# Patient Record
Sex: Male | Born: 1975 | Race: Black or African American | Hispanic: Yes | Marital: Single | Smoking: Current every day smoker
Health system: Southern US, Community
[De-identification: ages and names within clinical notes are randomized; demographics above are authoritative.]

## PROBLEM LIST (undated history)

## (undated) DIAGNOSIS — T7840XA Allergy, unspecified, initial encounter: Secondary | ICD-10-CM

## (undated) DIAGNOSIS — E119 Type 2 diabetes mellitus without complications: Secondary | ICD-10-CM

## (undated) DIAGNOSIS — B019 Varicella without complication: Secondary | ICD-10-CM

## (undated) DIAGNOSIS — K589 Irritable bowel syndrome without diarrhea: Secondary | ICD-10-CM

## (undated) DIAGNOSIS — I10 Essential (primary) hypertension: Secondary | ICD-10-CM

## (undated) HISTORY — DX: Essential (primary) hypertension: I10

## (undated) HISTORY — PX: WISDOM TOOTH EXTRACTION: SHX21

## (undated) HISTORY — DX: Type 2 diabetes mellitus without complications: E11.9

## (undated) HISTORY — DX: Irritable bowel syndrome, unspecified: K58.9

## (undated) HISTORY — DX: Allergy, unspecified, initial encounter: T78.40XA

## (undated) HISTORY — DX: Varicella without complication: B01.9

---

## 2017-07-22 ENCOUNTER — Encounter: Payer: Self-pay | Admitting: Family Medicine

## 2017-07-22 ENCOUNTER — Ambulatory Visit (INDEPENDENT_AMBULATORY_CARE_PROVIDER_SITE_OTHER): Payer: Managed Care, Other (non HMO) | Admitting: Family Medicine

## 2017-07-22 VITALS — BP 122/80 | HR 80 | Resp 12 | Ht 66.75 in | Wt 181.2 lb

## 2017-07-22 DIAGNOSIS — I10 Essential (primary) hypertension: Secondary | ICD-10-CM

## 2017-07-22 DIAGNOSIS — W57XXXA Bitten or stung by nonvenomous insect and other nonvenomous arthropods, initial encounter: Secondary | ICD-10-CM

## 2017-07-22 DIAGNOSIS — R202 Paresthesia of skin: Secondary | ICD-10-CM | POA: Diagnosis not present

## 2017-07-22 DIAGNOSIS — S30861A Insect bite (nonvenomous) of abdominal wall, initial encounter: Secondary | ICD-10-CM

## 2017-07-22 DIAGNOSIS — R198 Other specified symptoms and signs involving the digestive system and abdomen: Secondary | ICD-10-CM | POA: Insufficient documentation

## 2017-07-22 DIAGNOSIS — K5900 Constipation, unspecified: Secondary | ICD-10-CM | POA: Diagnosis not present

## 2017-07-22 DIAGNOSIS — R200 Anesthesia of skin: Secondary | ICD-10-CM

## 2017-07-22 DIAGNOSIS — E1149 Type 2 diabetes mellitus with other diabetic neurological complication: Secondary | ICD-10-CM | POA: Insufficient documentation

## 2017-07-22 LAB — LIPID PANEL
CHOLESTEROL: 147 mg/dL (ref 0–200)
HDL: 52 mg/dL (ref >39.00)
LDL CALC: 80 mg/dL (ref 0–99)
NonHDL: 94.6
TRIGLYCERIDES: 74 mg/dL (ref 0.0–149.0)
Total CHOL/HDL Ratio: 3
VLDL: 14.8 mg/dL (ref 0.0–40.0)

## 2017-07-22 LAB — COMPREHENSIVE METABOLIC PANEL
ALK PHOS: 57 U/L (ref 39–117)
ALT: 19 U/L (ref 0–53)
AST: 11 U/L (ref 0–37)
Albumin: 4.6 g/dL (ref 3.5–5.2)
BILIRUBIN TOTAL: 1.2 mg/dL (ref 0.2–1.2)
BUN: 8 mg/dL (ref 6–23)
CALCIUM: 9.4 mg/dL (ref 8.4–10.5)
CO2: 27 mEq/L (ref 19–32)
Chloride: 105 mEq/L (ref 96–112)
Creatinine, Ser: 0.58 mg/dL (ref 0.40–1.50)
GFR: 197.61 mL/min (ref >60.00)
GLUCOSE: 331 mg/dL — AB (ref 70–99)
POTASSIUM: 4.1 meq/L (ref 3.5–5.1)
Sodium: 139 mEq/L (ref 135–145)
TOTAL PROTEIN: 6.8 g/dL (ref 6.0–8.3)

## 2017-07-22 LAB — CBC
HEMATOCRIT: 46.7 % (ref 39.0–52.0)
Hemoglobin: 15.3 g/dL (ref 13.0–17.0)
MCHC: 32.8 g/dL (ref 30.0–36.0)
MCV: 80.9 fl (ref 78.0–100.0)
Platelets: 224 10*3/uL (ref 150.0–400.0)
RBC: 5.78 Mil/uL (ref 4.22–5.81)
RDW: 13.7 % (ref 11.5–15.5)
WBC: 4.6 10*3/uL (ref 4.0–10.5)

## 2017-07-22 LAB — POCT GLYCOSYLATED HEMOGLOBIN (HGB A1C): HEMOGLOBIN A1C: 13.1

## 2017-07-22 LAB — MICROALBUMIN / CREATININE URINE RATIO
CREATININE, U: 72 mg/dL
MICROALB/CREAT RATIO: 1 mg/g (ref 0.0–30.0)
Microalb, Ur: <0.7 mg/dL (ref 0.0–1.9)

## 2017-07-22 LAB — VITAMIN B12: Vitamin B-12: 396 pg/mL (ref 211–911)

## 2017-07-22 LAB — HEMOGLOBIN A1C: HEMOGLOBIN A1C: 13.2 % — AB (ref 4.6–6.5)

## 2017-07-22 LAB — TSH: TSH: 0.83 u[IU]/mL (ref 0.35–4.50)

## 2017-07-22 MED ORDER — LIRAGLUTIDE 18 MG/3ML ~~LOC~~ SOPN: 1.2000 mg | PEN_INJECTOR | Freq: Every day | SUBCUTANEOUS | 3 refills | Status: DC

## 2017-07-22 MED ORDER — ONETOUCH ULTRA 2 W/DEVICE KIT: 1.0000 | PACK | Freq: Two times a day (BID) | 0 refills | Status: AC

## 2017-07-22 MED ORDER — GLUCOSE BLOOD VI STRP: ORAL_STRIP | 3 refills | Status: AC

## 2017-07-22 MED ORDER — INSULIN NPH ISOPHANE & REGULAR (70-30) 100 UNIT/ML ~~LOC~~ SUSP: SUBCUTANEOUS | 1 refills | Status: DC

## 2017-07-22 MED ORDER — ONETOUCH ULTRASOFT LANCETS MISC: 3 refills | Status: AC

## 2017-07-22 NOTE — Progress Notes (Signed)
HPI:   Mitchell Oneill is a 41 y.o. male, who is here today with his girlfriend to establish care.  Former PCP: N/A Last preventive routine visit: 2-3 years ago.  Chronic medical problems: DM II,HTN, allergies, and past history of depression.   Concerns today:  Diabetes Mellitus II:   Diagnosed around 2000. Currently on Novolin 70/30 25 U bid.  Checking BS's : Not checking. Hypoglycemia: Denies  He is tolerating medications well. He was on Metformin before but discontinued because of GI side effects.  He denies abdominal pain, nausea, vomiting, or polyphagia. Polyuria, even through the night. She denies gross hematuria or dysuria. + Polydipsia.  C/O 2 years of intermittent feet aching,numbness,  Tingling sensation R > L.  Denies burning or tingling sensation, he also denies lower back pain or focal weakness.  Last eye exam 2 years ago.   -He is also requesting Lyme disease test. He is reporting several tick bites on legs and abdomen. Some mildly engorged and intubated, he is not sure about last incident. He denies arthralgias or skin rash.  C/O > 2 years of constipation, he denies any blood in the stool. He has bowel movements every 1-2 days. Denies abnormal weight loss, abdominal pain, nausea, vomiting,or melena. FHx negative for colon cancer.  HTN: Dx 2000. Currently he is on nonpharmacologic treatment. Denies severe/frequent headache, visual changes, chest pain, dyspnea, palpitation, claudication, focal weakness, or edema.  He denies history of HLD.  Review of Systems  Constitutional: Positive for fatigue. Negative for activity change, appetite change, fever and unexpected weight change.  HENT: Negative for nosebleeds, sore throat and trouble swallowing.   Eyes: Negative for redness and visual disturbance.  Respiratory: Negative for cough, shortness of breath and wheezing.   Cardiovascular: Negative for chest pain, palpitations and leg swelling.    Gastrointestinal: Negative for abdominal pain, nausea and vomiting.  Endocrine: Positive for polydipsia and polyuria. Negative for cold intolerance, heat intolerance and polyphagia.  Genitourinary: Positive for frequency. Negative for decreased urine volume, dysuria, flank pain and hematuria.  Musculoskeletal: Negative for gait problem and myalgias.  Skin: Negative for rash and wound.  Neurological: Positive for numbness. Negative for dizziness, syncope, weakness and headaches.  Hematological: Negative for adenopathy. Does not bruise/bleed easily.  Psychiatric/Behavioral: Positive for sleep disturbance (due to nocturia). Negative for confusion. The patient is not nervous/anxious.     No current outpatient prescriptions on file prior to visit.   No current facility-administered medications on file prior to visit.     Past Medical History:  Diagnosis Date  . Allergy   . Chicken pox   . Depression   . Diabetes mellitus without complication ([place])   . Hypertension    No Known Allergies  Family History  Problem Relation Age of Onset  . Arthritis Mother   . Hyperlipidemia Mother   . Arthritis Father   . Hyperlipidemia Father   . Diabetes Father   . Diabetes Sister   . Diabetes Maternal Grandfather   . Cancer Neg Hx     Social History   Social History  . Marital status: Single    Spouse name: N/A  . Number of children: N/A  . Years of education: N/A   Social History Main Topics  . Smoking status: Never Smoker  . Smokeless tobacco: Never Used  . Alcohol use Yes  . Drug use: No  . Sexual activity: Not Asked   Other Topics Concern  . None   Social History  Narrative  . None    Vitals:   07/22/17 1019  BP: 122/80  Pulse: 80  Resp: 12  SpO2: 98%    Body mass index is 28.6 kg/m.  Physical Exam  Nursing note and vitals reviewed. Constitutional: He is oriented to person, place, and time. He appears well-developed. No distress.  HENT:  Head: Normocephalic and  atraumatic.  Mouth/Throat: Oropharynx is clear and moist and mucous membranes are normal.  Eyes: Pupils are equal, round, and reactive to light. Conjunctivae and EOM are normal.  Neck: No tracheal deviation present. No thyroid mass and no thyromegaly present.  Cardiovascular: Normal rate and regular rhythm.   No murmur heard. Pulses:      Dorsalis pedis pulses are 2+ on the right side, and 2+ on the left side.  Respiratory: Effort normal and breath sounds normal. No respiratory distress.  GI: Soft. He exhibits no mass. There is no hepatomegaly. There is no tenderness.  Musculoskeletal: He exhibits no edema.  Lymphadenopathy:    He has no cervical adenopathy.  Neurological: He is alert and oriented to person, place, and time. He has normal strength. Gait normal.  Skin: Skin is warm. No rash noted. No erythema.  Psychiatric: He has a normal mood and affect. Cognition and memory are normal.  Well groomed, good eye contact.   Diabetic Foot Exam - Simple   Simple Foot Form Visual Inspection See comments:  Yes Sensation Testing See comments:  Yes Pulse Check Posterior Tibialis and Dorsalis pulse intact bilaterally:  Yes Comments   Monofilament decreased right . Peripheral pulses present (DP). No ulcers/excoriations  No calluses Mild hypertrophic/long toenails.      ASSESSMENT AND PLAN:   Mr. Heath Gold was seen today for establish care.  Diagnoses and all orders for this visit:  Lab Results  Component Value Date   HGBA1C 13.1 07/22/2017    Numbness and tingling  We discussed possible etiologies, most likely peripheral neuropathy. Educated about adequate with cane. Further recommendations will be given according to lab results.  -     TSH -     Comprehensive metabolic panel -     Vitamin B12 -     CBC -     Cancel: B. burgdorfi Antibody -     B. burgdorfi antibodies  Type II diabetes mellitus with neurological manifestations ([place])  HgA1C not at goal. Possible  complications of poorly controlled diabetes discussed. He is positive his health insurance would not cover it any other type instructed. Novolin 70/30 increased from 25 units twice a day to 45 units a.m. and continue 25 units p.m. Victoza added today, some side effects discussed. Regular exercise and healthy diet with avoidance of added sugar food intake is an important part of treatment and recommended.  He is to report eye examination. Periodic dental and foot care recommended. F/U in 3-4 months   -     POCT glycosylated hemoglobin (Hb A1C) -     Microalbumin / creatinine urine ratio -     Comprehensive metabolic panel -     Fructosamine -     liraglutide 18 MG/3ML SOPN; Inject 0.2 mLs (1.2 mg total) into the skin daily. -     Lipid panel -     insulin NPH-regular Human (NOVOLIN 70/30) (70-30) 100 UNIT/ML injection; 35 U in am and 25 U 10 min before meals -     glucose blood test strip; Use as instructed -     Lancets (ONETOUCH ULTRASOFT) lancets; Use  as instructed -     Blood Glucose Monitoring Suppl (ONE TOUCH ULTRA 2) w/Device KIT; 1 Device by Does not apply route 2 (two) times daily.  Hypertension, essential, benign  Adequately controlled. Continue nonpharmacologic treatment. Continue low-salt diet F/U in 6 months, before if needed.  -     CBC  Tick bite of abdomen, initial encounter  Today no rash appreciated. Further recommendations will be given according to lab results.  -     B. burgdorfi antibodies  Constipation, unspecified constipation type  Adequate fluid and fiber intake. OTC MiraLAX can also be use daily as needed. Further recommendations would be given according to lab results. F/U in 3 months.  -     TSH -     Comprehensive metabolic panel   Kaysia Willard G. Martinique, MD  [facility]. [place] office.

## 2017-07-22 NOTE — Patient Instructions (Addendum)
A few things to remember from today's visit:   Numbness and tingling - Plan: TSH, Comprehensive metabolic panel, Vitamin B12, CBC, B. burgdorfi Antibody  Type II diabetes mellitus with neurological manifestations (HCC) - Plan: POCT glycosylated hemoglobin (Hb A1C), Microalbumin / creatinine urine ratio, Comprehensive metabolic panel, Hemoglobin A1c, Fructosamine, Lipid panel, insulin NPH-regular Human (NOVOLIN 70/30) (70-30) 100 UNIT/ML injection  Hypertension, essential, benign - Plan: CBC  Tick bite of abdomen, initial encounter - Plan: B. burgdorfi Antibody  HgA1C goal < 7.0. Avoid sugar added food:regular soft drinks, energy drinks, and sports drinks. candy. cakes. cookies. pies and cobblers. sweet rolls, pastries, and donuts. fruit drinks, such as fruitades and fruit punch. dairy desserts, such as ice cream  Mediterranean diet has showed benefits for sugar control.  How much and what type of carbohydrate foods are important for managing diabetes. The balance between how much insulin is in your body and the carbohydrate you eat makes a difference in your blood glucose levels.  Fasting blood sugar ideally 130 or less, 2 hours after meals less than 180.   Regular exercise also will help with controlling disease, daily brisk walking as tolerated for 15-30 min definitively will help.   Avoid skipping meals, blood sugar might drop and cause serious problems. Remember checking feet periodically, good dental hygiene, and annual eye exam.   Victoza start 0.6 mg and increased to 1.2 in 2 weeks. Insulin increase to 35 U am and continue 25 U at night.    Please be sure medication list is accurate. If a new problem present, please set up appointment sooner than planned today.

## 2017-07-24 ENCOUNTER — Telehealth: Payer: Self-pay | Admitting: Family Medicine

## 2017-07-24 LAB — FRUCTOSAMINE: FRUCTOSAMINE: 531 umol/L — AB (ref 190–270)

## 2017-07-24 LAB — B. BURGDORFI ANTIBODIES

## 2017-07-24 NOTE — Telephone Encounter (Signed)
Pt calling wanting lab results

## 2017-07-25 ENCOUNTER — Encounter: Payer: Self-pay | Admitting: Family Medicine

## 2017-07-25 ENCOUNTER — Other Ambulatory Visit: Payer: Self-pay | Admitting: Family Medicine

## 2017-07-25 DIAGNOSIS — E1149 Type 2 diabetes mellitus with other diabetic neurological complication: Secondary | ICD-10-CM

## 2017-07-25 MED ORDER — INSULIN NPH ISOPHANE & REGULAR (70-30) 100 UNIT/ML ~~LOC~~ SUSP: SUBCUTANEOUS | 3 refills | Status: DC

## 2017-07-25 NOTE — Telephone Encounter (Signed)
Results sent to patient

## 2017-07-29 ENCOUNTER — Telehealth: Payer: Self-pay

## 2017-07-29 NOTE — Telephone Encounter (Signed)
Received PA request for Victoza. PA submitted & is pending. Key: [id number]

## 2017-07-30 ENCOUNTER — Other Ambulatory Visit: Payer: Self-pay | Admitting: Family Medicine

## 2017-07-30 DIAGNOSIS — E1149 Type 2 diabetes mellitus with other diabetic neurological complication: Secondary | ICD-10-CM

## 2017-07-30 MED ORDER — DULAGLUTIDE 0.75 MG/0.5ML ~~LOC~~ SOAJ: 0.7500 mg | SUBCUTANEOUS | 0 refills | Status: DC

## 2017-08-04 NOTE — Telephone Encounter (Signed)
Pa denied. Submitted PA for Trulicity, PA approved & form faxed back to pharmacy.

## 2017-08-14 ENCOUNTER — Encounter: Payer: Self-pay | Admitting: Family Medicine

## 2017-08-18 ENCOUNTER — Other Ambulatory Visit: Payer: Self-pay | Admitting: Family Medicine

## 2017-08-18 DIAGNOSIS — E1149 Type 2 diabetes mellitus with other diabetic neurological complication: Secondary | ICD-10-CM

## 2017-08-18 MED ORDER — INSULIN NPH ISOPHANE & REGULAR (70-30) 100 UNIT/ML ~~LOC~~ SUSP: SUBCUTANEOUS | 3 refills | Status: DC

## 2017-08-18 MED ORDER — DULAGLUTIDE 1.5 MG/0.5ML ~~LOC~~ SOAJ
1.5000 mg | SUBCUTANEOUS | 1 refills | Status: DC
Start: 2017-08-18 — End: 2018-01-12

## 2017-08-25 ENCOUNTER — Other Ambulatory Visit: Payer: Self-pay | Admitting: Family Medicine

## 2017-08-25 ENCOUNTER — Telehealth: Payer: Self-pay

## 2017-08-25 DIAGNOSIS — E1149 Type 2 diabetes mellitus with other diabetic neurological complication: Secondary | ICD-10-CM

## 2017-08-25 MED ORDER — INSULIN ISOPHANE & REGULAR (HUMAN 70-30)100 UNIT/ML KWIKPEN: PEN_INJECTOR | SUBCUTANEOUS | 3 refills | Status: DC

## 2017-08-25 NOTE — Telephone Encounter (Signed)
Pharmacy is calling because the Novolin is not covered by the insurance. They will cover the Humulin 70/30.

## 2017-08-25 NOTE — Telephone Encounter (Signed)
Humulin Rx sent to his pharmacy.  Thanks, BJ

## 2017-08-28 ENCOUNTER — Encounter: Payer: Self-pay | Admitting: Family Medicine

## 2017-10-21 NOTE — Progress Notes (Signed)
HPI:   [name] is a 41 y.o. male, who is here today with his girlfriend for 3-4 months follow up.   He was last seen on 07/22/17.   DM II  Dx around 2000. Currently he is on Trulicity 1.5 mg weekly and Humulin 70-30 35 U am and 25 U pm. He is taking insulin about 1-2 times per week and just night dose. States that he was having low BS's, 80's-130's, was feeling "shaking." Last time he tooks insulin was 2 days ago.  Here in the office while he was waiting to be seen he voiced concerned for low BS, fingerstick done: 137.  Last eye exam: Overdue. FG 90's to 130-140's Post prandial glucose: 130-150  Hypoglycemia: None < 70.   Denies abdominal pain, nausea,vomiting, polydipsia,polyuria, or polyphagia.  + Feet tingling/numb sensation, stable.  Lab Results  Component Value Date   HGBA1C 13.2 (H) 07/22/2017   Lab Results  Component Value Date   MICROALBUR <0.7 07/22/2017   Lab Results  Component Value Date   CREATININE 0.58 07/22/2017   BUN 8 07/22/2017   NA 139 07/22/2017   K 4.1 07/22/2017   CL 105 07/22/2017   CO2 27 07/22/2017     Constipation:  Last OV Miralax and dietary changes recommended, he did not get Mirlax Bowel movement every day but still hard. He denies blood in stool.  Today he is c/o burping with "rotten egg/sulfar smell." + Acid reflux but denies heartburn,abdominal pain,nausea,or vomiting. Hx of GERD, he took Omeprazole and Nexium in the past. He has tried OTC Pepto bismol, it helps some.  He has symptoms about 2 times per week.  Exacerbated by food intake, he has not identified type of food that aggravate symptoms. No alleviating factors identified. OTC Pepto Bismol.     Requesting referral to urology. 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 His girlfriend is trying to get pregnant, her gyn recommended him to be evaluated by urologists. He has one child, 48 yo.    Review of Systems    Constitutional: Negative for activity change, appetite change, fatigue, fever and unexpected weight change.  HENT: Negative for nosebleeds, sore throat, trouble swallowing and voice change.   Eyes: Negative for redness and visual disturbance.  Respiratory: Negative for cough, shortness of breath and wheezing.   Cardiovascular: Negative for chest pain, palpitations and leg swelling.  Gastrointestinal: Positive for constipation. Negative for abdominal pain, blood in stool, nausea and vomiting.  Endocrine: Negative for cold intolerance, heat intolerance, polydipsia, polyphagia and polyuria.  Genitourinary: Negative for decreased urine volume, dysuria, hematuria and penile swelling.  Musculoskeletal: Negative for gait problem and myalgias.  Skin: Negative for rash and wound.  Neurological: Negative for dizziness, syncope, weakness and headaches.  Psychiatric/Behavioral: Negative for confusion. The patient is nervous/anxious.      Current Outpatient Medications on File Prior to Visit  Medication Sig Dispense Refill  . Blood Glucose Monitoring Suppl (ONE TOUCH ULTRA 2) w/Device KIT 1 Device by Does not apply route 2 (two) times daily. 1 each 0  . Dulaglutide (TRULICITY) 1.5 NK/5.3ZJ SOPN Inject 1.5 mg into the skin once a week. 13 pen 1  . glucose blood test strip Use as instructed 180 each 3  . Lancets (ONETOUCH ULTRASOFT) lancets Use as instructed 180 each 3   No current facility-administered medications on file prior to visit.      Past Medical History:  Diagnosis Date  . Allergy   .  Chicken pox   . Depression   . Diabetes mellitus without complication ([place])   . Hypertension    No Known Allergies  Social History   Socioeconomic History  . Marital status: Single    Spouse name: None  . Number of children: None  . Years of education: None  . Highest education level: None  Social Needs  . Financial resource strain: None  . Food insecurity - worry: None  . Food insecurity -  inability: None  . Transportation needs - medical: None  . Transportation needs - non-medical: None  Occupational History  . None  Tobacco Use  . Smoking status: Never Smoker  . Smokeless tobacco: Never Used  Substance and Sexual Activity  . Alcohol use: Yes  . Drug use: No  . Sexual activity: None  Other Topics Concern  . None  Social History Narrative  . None    Vitals:   10/22/17 0938 10/22/17 1101  BP: 122/76   Pulse: (!) 101 88  Resp: 12   Temp: 99 F (37.2 C)   SpO2: 100%    Body mass index is 29.39 kg/m.   Physical Exam  Nursing note and vitals reviewed. Constitutional: He is oriented to person, place, and time. He appears well-developed. No distress.  HENT:  Head: Normocephalic and atraumatic.  Mouth/Throat: Oropharynx is clear and moist and mucous membranes are normal.  Eyes: Conjunctivae are normal. Pupils are equal, round, and reactive to light.  Cardiovascular: Normal rate and regular rhythm.  No murmur heard. Pulses:      Dorsalis pedis pulses are 2+ on the right side, and 2+ on the left side.  Respiratory: Effort normal and breath sounds normal. No respiratory distress.  GI: Soft. He exhibits no mass. There is no hepatomegaly. There is tenderness (mild) in the epigastric area. There is no rebound and no guarding.  Genitourinary:  Genitourinary Comments: Deferred to urologist.  Musculoskeletal: He exhibits no edema.  Lymphadenopathy:    He has no cervical adenopathy.  Neurological: He is alert and oriented to person, place, and time. He has normal strength. Gait normal.  Skin: Skin is warm. No rash noted. No erythema.  Psychiatric: His mood appears anxious.  Well groomed, good eye contact.    ASSESSMENT AND PLAN:   [name] was seen today for 3-4 months follow-up.   Diagnoses and all orders for this visit:  Lab Results  Component Value Date   HGBA1C 7.0 10/22/2017    Gastroesophageal reflux disease, esophagitis presence not  specified  GERD precautions discussed. Treatment options discussed, Omeprazole 40 mg daily recommended. Instructed about warning signs. F/U in 4 months.  -     omeprazole (PRILOSEC) 40 MG capsule; Take 1 capsule (40 mg total) daily by mouth.  Type II diabetes mellitus with neurological manifestations ([place])  HgA1C improved. Humulin discontinued. Glipizide 5 mg daily started. No changes in Trulicity. Side effects discussed and risk of hypoglycemia if he skips meals.  Regular exercise and healthy diet with avoidance of added sugar food intake is an important part of treatment and recommended. Annual eye exam, he needs to schedule one. Periodic dental and foot care recommended. F/U in 4 months  -     glipiZIDE (GLUCOTROL) 5 MG tablet; 1 tab 15 min before a good meal. -     POCT glucose (manual entry) -     POCT glycosylated hemoglobin (Hb A1C)  Constipation, unspecified constipation type  Increase fiber and fluid intake. Miralax daily as needed.  Instructed about warning signs.  Retrograde ejaculation  Referral placed as requested.  -     Ambulatory referral to Urology  Need for influenza vaccination -     Flu Vaccine QUAD 36+ mos IM   -[name] was advised to return sooner than planned today if new concerns arise.       Betty G. Martinique, MD  [facility]. [place] office.

## 2017-10-22 ENCOUNTER — Encounter: Payer: Self-pay | Admitting: Family Medicine

## 2017-10-22 ENCOUNTER — Ambulatory Visit (INDEPENDENT_AMBULATORY_CARE_PROVIDER_SITE_OTHER): Payer: Managed Care, Other (non HMO) | Admitting: Family Medicine

## 2017-10-22 VITALS — BP 122/76 | HR 88 | Temp 99.0°F | Resp 12 | Ht 66.75 in | Wt 186.2 lb

## 2017-10-22 DIAGNOSIS — N5314 Retrograde ejaculation: Secondary | ICD-10-CM

## 2017-10-22 DIAGNOSIS — K219 Gastro-esophageal reflux disease without esophagitis: Secondary | ICD-10-CM

## 2017-10-22 DIAGNOSIS — Z23 Encounter for immunization: Secondary | ICD-10-CM | POA: Diagnosis not present

## 2017-10-22 DIAGNOSIS — K5900 Constipation, unspecified: Secondary | ICD-10-CM | POA: Diagnosis not present

## 2017-10-22 DIAGNOSIS — E1149 Type 2 diabetes mellitus with other diabetic neurological complication: Secondary | ICD-10-CM

## 2017-10-22 LAB — POCT GLYCOSYLATED HEMOGLOBIN (HGB A1C): Hemoglobin A1C: 7

## 2017-10-22 LAB — GLUCOSE, POCT (MANUAL RESULT ENTRY): POC Glucose: 137 mg/dl — AB (ref 70–99)

## 2017-10-22 MED ORDER — GLIPIZIDE 5 MG PO TABS: ORAL_TABLET | ORAL | 3 refills | Status: DC

## 2017-10-22 MED ORDER — OMEPRAZOLE 40 MG PO CPDR: 40.0000 mg | DELAYED_RELEASE_CAPSULE | Freq: Every day | ORAL | 3 refills | Status: DC

## 2017-10-22 NOTE — Patient Instructions (Signed)
A few things to remember from today's visit:   Gastroesophageal reflux disease, esophagitis presence not specified - Plan: omeprazole (PRILOSEC) 40 MG capsule  Type II diabetes mellitus with neurological manifestations (HCC) - Plan: glipiZIDE (GLUCOTROL) 5 MG tablet, POCT glucose (manual entry), POCT glycosylated hemoglobin (Hb A1C)  Hypertension, essential, benign  Constipation, unspecified constipation type  Need for influenza vaccination - Plan: Flu Vaccine QUAD 36+ mos IM  Retrograde ejaculation - Plan: Ambulatory referral to Urology  Stop Insulin. Start Glipizide.  Benafiber 3 ties per day and Miralax for constipation.  Please be sure medication list is accurate. If a new problem present, please set up appointment sooner than planned today.

## 2017-12-08 ENCOUNTER — Other Ambulatory Visit: Payer: Self-pay | Admitting: *Deleted

## 2017-12-08 DIAGNOSIS — K219 Gastro-esophageal reflux disease without esophagitis: Secondary | ICD-10-CM

## 2017-12-08 MED ORDER — OMEPRAZOLE 40 MG PO CPDR: 40.0000 mg | DELAYED_RELEASE_CAPSULE | Freq: Every day | ORAL | 1 refills | Status: DC

## 2018-01-12 ENCOUNTER — Other Ambulatory Visit: Payer: Self-pay | Admitting: Family Medicine

## 2018-01-12 DIAGNOSIS — E1149 Type 2 diabetes mellitus with other diabetic neurological complication: Secondary | ICD-10-CM

## 2018-01-28 LAB — HM DIABETES EYE EXAM

## 2018-02-13 ENCOUNTER — Encounter: Payer: Self-pay | Admitting: Family Medicine

## 2018-06-08 ENCOUNTER — Other Ambulatory Visit: Payer: Self-pay | Admitting: Family Medicine

## 2018-06-08 DIAGNOSIS — E1149 Type 2 diabetes mellitus with other diabetic neurological complication: Secondary | ICD-10-CM

## 2018-11-26 ENCOUNTER — Other Ambulatory Visit: Payer: Self-pay | Admitting: Family Medicine

## 2018-11-26 DIAGNOSIS — E1149 Type 2 diabetes mellitus with other diabetic neurological complication: Secondary | ICD-10-CM

## 2018-12-14 ENCOUNTER — Ambulatory Visit (INDEPENDENT_AMBULATORY_CARE_PROVIDER_SITE_OTHER): Payer: BLUE CROSS/BLUE SHIELD | Admitting: Family Medicine

## 2018-12-14 ENCOUNTER — Encounter: Payer: Self-pay | Admitting: Family Medicine

## 2018-12-14 VITALS — BP 122/84 | HR 96 | Temp 99.1°F | Resp 12 | Ht 66.5 in | Wt 194.5 lb

## 2018-12-14 DIAGNOSIS — Z0000 Encounter for general adult medical examination without abnormal findings: Secondary | ICD-10-CM | POA: Diagnosis not present

## 2018-12-14 DIAGNOSIS — Z23 Encounter for immunization: Secondary | ICD-10-CM | POA: Diagnosis not present

## 2018-12-14 DIAGNOSIS — R198 Other specified symptoms and signs involving the digestive system and abdomen: Secondary | ICD-10-CM

## 2018-12-14 DIAGNOSIS — E1149 Type 2 diabetes mellitus with other diabetic neurological complication: Secondary | ICD-10-CM | POA: Diagnosis not present

## 2018-12-14 DIAGNOSIS — I10 Essential (primary) hypertension: Secondary | ICD-10-CM

## 2018-12-14 MED ORDER — DULAGLUTIDE 1.5 MG/0.5ML ~~LOC~~ SOAJ: SUBCUTANEOUS | 1 refills | Status: DC

## 2018-12-14 NOTE — Assessment & Plan Note (Signed)
BP adequately controlled. Continue non pharmacologic treatment. 

## 2018-12-14 NOTE — Assessment & Plan Note (Signed)
HgA1C pending today. No changes in current management.  Caution with skipping meals. Regular exercise and healthy diet with avoidance of added sugar food intake is an important part of treatment and recommended. Annual eye exam, periodic dental and foot care recommended. F/U in 5-6 months

## 2018-12-14 NOTE — Assessment & Plan Note (Signed)
Most likely IBS. Because this seems to be going on for the past 3 years, relatively new problem, he may need a colonoscopy. GI referral placed.

## 2018-12-14 NOTE — Progress Notes (Signed)
HPI:  Mr. Fernandez Kenley is a 43 y.o.male here today with his girlfriend for his routine physical examination.  Last CPE: > 1 year ago. He lives with girlfriend.  Regular exercise 3 or more times per week: No consistently but he has an active job. Following a healthy diet: In general he does. He eat at home mainly.   Chronic medical problems: DM 2, hypertension, and constipation among some.  Hx of STD's: Negative.  Immunization History  Administered Date(s) Administered  . Influenza,inj,Quad PF,6+ Mos 10/22/2017  . Tdap 12/10/2015     Last colon cancer screening: N/A Last prostate ca screening: Recently evaluated by urologist as part of infertility work-up.  According to girlfriend, he had rectal examination and blood work done.  -Denies high alcohol intake or Hx of illicit drug use. Former smoker, currently he is using nicotine vape.  -Concerns and/or follow up today:   Last follow-up visit in 10/2017.  Diabetes Mellitus II:  Currently on Trulicity 1.5 mg weekly and glipizide 5 mg daily. Last Rx for Glipizide was in 10/2017 #30/3. Checking BS's : 140-170's.  He has not checking postprandial BS. Hypoglycemia:Denies. Reporting a BS 80's, felt "shaky."   He is tolerating medications well. He denies abdominal pain, nausea, vomiting, polydipsia, polyuria, or polyphagia. Negative for numbness, tingling, or burning. He has reported numbness and tingling in 03/2017.   Lab Results  Component Value Date   CREATININE 0.58 07/22/2017   BUN 8 07/22/2017   NA 139 07/22/2017   K 4.1 07/22/2017   CL 105 07/22/2017   CO2 27 07/22/2017    Lab Results  Component Value Date   HGBA1C 7.0 10/22/2017   Lab Results  Component Value Date   MICROALBUR <0.7 07/22/2017   Hypertension: Currently he is on nonpharmacologic treatment. He is not checking BP regularly. Denies severe/frequent headache, visual changes, chest pain, dyspnea, palpitation, claudication, focal weakness,  or edema.  Lab Results  Component Value Date   CHOL 147 07/22/2017   HDL 52.00 07/22/2017   LDLCALC 80 07/22/2017   TRIG 74.0 07/22/2017   CHOLHDL 3 07/22/2017   C/O episodes od diarrhea,intermitent and constipation. Diarrhea >constipation. Has had problem for about 3 years.  Problem exacerbated by food intake.  He has not noted blood in stool,melena,or abnormal wt loss.   Review of Systems  Constitutional: Negative for activity change, appetite change, fatigue, fever and unexpected weight change.  HENT: Negative for dental problem, nosebleeds, sore throat, trouble swallowing and voice change.   Eyes: Negative for redness and visual disturbance.  Respiratory: Negative for cough, shortness of breath and wheezing.   Cardiovascular: Negative for chest pain, palpitations and leg swelling.  Gastrointestinal: Positive for constipation and diarrhea. Negative for abdominal pain, blood in stool, nausea and vomiting.  Endocrine: Negative for cold intolerance, heat intolerance, polydipsia, polyphagia and polyuria.  Genitourinary: Negative for decreased urine volume, dysuria, genital sores, hematuria and testicular pain.  Musculoskeletal: Negative for gait problem and myalgias.  Skin: Positive for wound (Left pretibial area, s/p fall). Negative for color change and rash.  Neurological: Negative for syncope, weakness, numbness and headaches.  Hematological: Negative for adenopathy. Does not bruise/bleed easily.  Psychiatric/Behavioral: Negative for confusion and sleep disturbance. The patient is not nervous/anxious.   All other systems reviewed and are negative.    Current Outpatient Medications on File Prior to Visit  Medication Sig Dispense Refill  . Blood Glucose Monitoring Suppl (ONE TOUCH ULTRA 2) w/Device KIT 1 Device by Does not  apply route 2 (two) times daily. 1 each 0  . glipiZIDE (GLUCOTROL) 5 MG tablet 1 tab 15 min before a good meal. 30 tablet 3  . glucose blood test strip Use  as instructed 180 each 3  . Lancets (ONETOUCH ULTRASOFT) lancets Use as instructed 180 each 3  . omeprazole (PRILOSEC) 40 MG capsule Take 1 capsule (40 mg total) by mouth daily. 90 capsule 1   No current facility-administered medications on file prior to visit.      Past Medical History:  Diagnosis Date  . Allergy   . Chicken pox   . Depression   . Diabetes mellitus without complication ([place])   . Hypertension     History reviewed. No pertinent surgical history.  No Known Allergies  Family History  Problem Relation Age of Onset  . Arthritis Mother   . Hyperlipidemia Mother   . Arthritis Father   . Hyperlipidemia Father   . Diabetes Father   . Diabetes Sister   . Diabetes Maternal Grandfather   . Cancer Neg Hx     Social History   Socioeconomic History  . Marital status: Single    Spouse name: Not on file  . Number of children: Not on file  . Years of education: Not on file  . Highest education level: Not on file  Occupational History  . Not on file  Social Needs  . Financial resource strain: Not on file  . Food insecurity:    Worry: Not on file    Inability: Not on file  . Transportation needs:    Medical: Not on file    Non-medical: Not on file  Tobacco Use  . Smoking status: Never Smoker  . Smokeless tobacco: Never Used  Substance and Sexual Activity  . Alcohol use: Yes  . Drug use: No  . Sexual activity: Not on file  Lifestyle  . Physical activity:    Days per week: Not on file    Minutes per session: Not on file  . Stress: Not on file  Relationships  . Social connections:    Talks on phone: Not on file    Gets together: Not on file    Attends religious service: Not on file    Active member of club or organization: Not on file    Attends meetings of clubs or organizations: Not on file    Relationship status: Not on file  Other Topics Concern  . Not on file  Social History Narrative  . Not on file     Vitals:   12/14/18 1629  BP: 122/84   Pulse: 96  Resp: 12  Temp: 99.1 F (37.3 C)  SpO2: 99%   Body mass index is 30.92 kg/m.   Wt Readings from Last 3 Encounters:  12/14/18 194 lb 8 oz (88.2 kg)  10/22/17 186 lb 4 oz (84.5 kg)  07/22/17 181 lb 4 oz (82.2 kg)    Physical Exam  Nursing note and vitals reviewed. Constitutional: He is oriented to person, place, and time. He appears well-developed. No distress.  HENT:  Head: Normocephalic and atraumatic.  Right Ear: Tympanic membrane, external ear and ear canal normal.  Left Ear: Tympanic membrane, external ear and ear canal normal.  Mouth/Throat: Oropharynx is clear and moist and mucous membranes are normal.  Eyes: Pupils are equal, round, and reactive to light. Conjunctivae and EOM are normal.  Neck: Normal range of motion. No tracheal deviation present. No thyromegaly present.  Cardiovascular: Normal rate and regular  rhythm.  No murmur heard. Pulses:      Dorsalis pedis pulses are 2+ on the right side and 2+ on the left side.  Respiratory: Effort normal and breath sounds normal. No respiratory distress.  GI: Soft. He exhibits no mass. There is no hepatomegaly. There is no abdominal tenderness.  Genitourinary:    Genitourinary Comments: Deferred to urologist,no concerns today.   Musculoskeletal:        General: No tenderness or edema.     Comments: No major deformities appreciated and no signs of synovitis.  Lymphadenopathy:    He has no cervical adenopathy.       Right: No supraclavicular adenopathy present.       Left: No supraclavicular adenopathy present.  Neurological: He is alert and oriented to person, place, and time. He has normal strength. No cranial nerve deficit or sensory deficit. Coordination and gait normal.  Reflex Scores:      Bicep reflexes are 2+ on the right side and 2+ on the left side.      Patellar reflexes are 2+ on the right side and 2+ on the left side. Skin: Skin is warm. Abrasion noted. No rash noted. No erythema.       Superficial vertical laceration left pretibial area, no surrounded erythema or edema.  Psychiatric: He has a normal mood and affect. Cognition and memory are normal.     ASSESSMENT AND PLAN:  Mr. Lexander was seen today for annual exam and chronic disease management.   Orders Placed This Encounter  Procedures  . Pneumococcal polysaccharide vaccine 23-valent greater than or equal to 2yo subcutaneous/IM  . Flu Vaccine QUAD 36+ mos IM  . Lipid panel  . Hemoglobin A1c  . Comprehensive metabolic panel  . Microalbumin / creatinine urine ratio  . Ambulatory referral to Gastroenterology   Lab Results  Component Value Date   HGBA1C 6.4 12/14/2018   Lab Results  Component Value Date   ALT 19 12/14/2018   AST 13 12/14/2018   ALKPHOS 52 12/14/2018   BILITOT 0.8 12/14/2018   Lab Results  Component Value Date   CREATININE 0.81 12/14/2018   BUN 13 12/14/2018   NA 139 12/14/2018   K 4.7 12/14/2018   CL 102 12/14/2018   CO2 30 12/14/2018   Lab Results  Component Value Date   CHOL 133 12/14/2018   HDL 51.40 12/14/2018   LDLCALC 60 12/14/2018   TRIG 109.0 12/14/2018   CHOLHDL 3 12/14/2018   Lab Results  Component Value Date   MICROALBUR 1.1 12/14/2018    Routine general medical examination at a health care facility We discussed the importance of regular physical activity and healthy diet for prevention of chronic illness and/or complications. Preventive guidelines reviewed. Vaccination updated.  Next CPE in a year.   Type II diabetes mellitus with neurological manifestations ([place]) HgA1C pending today. No changes in current management.  Caution with skipping meals. Regular exercise and healthy diet with avoidance of added sugar food intake is an important part of treatment and recommended. Annual eye exam, periodic dental and foot care recommended. F/U in 5-6 months   Alternating constipation and diarrhea Most likely IBS. Because this seems to be going on for the  past 3 years, relatively new problem, he may need a colonoscopy. GI referral placed.  Hypertension, essential, benign BP adequately controlled. Continue nonpharmacologic treatment.   Need for pneumococcal vaccination -     Pneumococcal polysaccharide vaccine 23-valent greater than or equal to 2yo subcutaneous/IM  Need  for immunization against influenza -     Flu Vaccine QUAD 36+ mos IM    Return in about 6 months (around 06/14/2019) for DM II,HTN.     Lillyian Heidt G. Martinique, MD  [facility]. [place] office.

## 2018-12-14 NOTE — Patient Instructions (Addendum)
A few things to remember from today's visit:   Routine general medical examination at a health care facility  Type II diabetes mellitus with neurological manifestations (HCC) - Plan: Lipid panel, Hemoglobin A1c, Comprehensive metabolic panel, Microalbumin / creatinine urine ratio  Hypertension, essential, benign - Plan: Comprehensive metabolic panel  Alternating constipation and diarrhea - Plan: Ambulatory referral to Gastroenterology   At least 150 minutes of moderate exercise per week, daily brisk walking for 15-30 min is a good exercise option. Healthy diet low in saturated (animal) fats and sweets and consisting of fresh fruits and vegetables, lean meats such as fish and white chicken and whole grains.  - Vaccines:  Tdap vaccine every 10 years.  Shingles vaccine recommended at age 44, could be given after 44 years of age but not sure about insurance coverage.  Pneumonia vaccines:  Prevnar 13 at 65 and Pneumovax at 18.   -Screening recommendations for low/normal risk males:   Lipid screening annually.  Colon cancer screening at age 73 and until age 33.  Prostate cancer screening: some controversy, starts usually at 50: Rectal exam and PSA.  Aortic Abdominal Aneurism once between 69 and 62 years old if ever smoker.  Also recommended:  1. Dental visit- Brush and floss your teeth twice daily; visit your dentist twice a year. 2. Eye doctor- Get an eye exam at least every 2 years. 3. Helmet use- Always wear a helmet when riding a bicycle, motorcycle, rollerblading or skateboarding. 4. Safe sex- If you may be exposed to sexually transmitted infections, use a condom. 5. Seat belts- Seat belts can save your live; always wear one. 6. Smoke/Carbon Monoxide detectors- These detectors need to be installed on the appropriate level of your home. Replace batteries at least once a year. 7. Skin cancer- When out in the sun please cover up and use sunscreen 15 SPF or higher. 8. Violence-  If anyone is threatening or hurting you, please tell your healthcare provider.  9. Drink alcohol in moderation- Limit alcohol intake to one drink or less per day. Never drink and drive.  Please be sure medication list is accurate. If a new problem present, please set up appointment sooner than planned today.

## 2018-12-15 ENCOUNTER — Encounter: Payer: Self-pay | Admitting: Family Medicine

## 2018-12-15 LAB — COMPREHENSIVE METABOLIC PANEL
ALT: 19 U/L (ref 0–53)
AST: 13 U/L (ref 0–37)
Albumin: 4.4 g/dL (ref 3.5–5.2)
Alkaline Phosphatase: 52 U/L (ref 39–117)
BUN: 13 mg/dL (ref 6–23)
CALCIUM: 9.8 mg/dL (ref 8.4–10.5)
CO2: 30 meq/L (ref 19–32)
Chloride: 102 mEq/L (ref 96–112)
Creatinine, Ser: 0.81 mg/dL (ref 0.40–1.50)
GFR: 133.51 mL/min (ref >60.00)
Glucose, Bld: 99 mg/dL (ref 70–99)
Potassium: 4.7 mEq/L (ref 3.5–5.1)
Sodium: 139 mEq/L (ref 135–145)
Total Bilirubin: 0.8 mg/dL (ref 0.2–1.2)
Total Protein: 7.3 g/dL (ref 6.0–8.3)

## 2018-12-15 LAB — LIPID PANEL
Cholesterol: 133 mg/dL (ref 0–200)
HDL: 51.4 mg/dL (ref >39.00)
LDL Cholesterol: 60 mg/dL (ref 0–99)
NonHDL: 81.8
Total CHOL/HDL Ratio: 3
Triglycerides: 109 mg/dL (ref 0.0–149.0)
VLDL: 21.8 mg/dL (ref 0.0–40.0)

## 2018-12-15 LAB — MICROALBUMIN / CREATININE URINE RATIO
Creatinine,U: 228.7 mg/dL
Microalb Creat Ratio: 0.5 mg/g (ref 0.0–30.0)
Microalb, Ur: 1.1 mg/dL (ref 0.0–1.9)

## 2018-12-15 LAB — HEMOGLOBIN A1C: Hgb A1c MFr Bld: 6.4 % (ref 4.6–6.5)

## 2018-12-22 ENCOUNTER — Other Ambulatory Visit: Payer: Self-pay | Admitting: *Deleted

## 2018-12-22 MED ORDER — PRAVASTATIN SODIUM 10 MG PO TABS: 10.0000 mg | ORAL_TABLET | Freq: Every day | ORAL | 2 refills | Status: DC

## 2018-12-25 ENCOUNTER — Encounter: Payer: Self-pay | Admitting: Gastroenterology

## 2019-01-18 ENCOUNTER — Ambulatory Visit (INDEPENDENT_AMBULATORY_CARE_PROVIDER_SITE_OTHER): Payer: BLUE CROSS/BLUE SHIELD | Admitting: Gastroenterology

## 2019-01-18 ENCOUNTER — Encounter: Payer: Self-pay | Admitting: Gastroenterology

## 2019-01-18 ENCOUNTER — Other Ambulatory Visit (INDEPENDENT_AMBULATORY_CARE_PROVIDER_SITE_OTHER): Payer: BLUE CROSS/BLUE SHIELD

## 2019-01-18 VITALS — BP 102/76 | HR 92 | Ht 67.0 in | Wt 196.0 lb

## 2019-01-18 DIAGNOSIS — R197 Diarrhea, unspecified: Secondary | ICD-10-CM

## 2019-01-18 LAB — SEDIMENTATION RATE: Sed Rate: 23 mm/hr — ABNORMAL HIGH (ref 0–15)

## 2019-01-18 LAB — C-REACTIVE PROTEIN: CRP: <1 mg/dL (ref 0.5–20.0)

## 2019-01-18 LAB — IGA: IgA: 357 mg/dL (ref 68–378)

## 2019-01-18 LAB — TSH: TSH: 1.05 u[IU]/mL (ref 0.35–4.50)

## 2019-01-18 MED ORDER — DICYCLOMINE HCL 20 MG PO TABS: 20.0000 mg | ORAL_TABLET | Freq: Three times a day (TID) | ORAL | 3 refills | Status: DC

## 2019-01-18 MED ORDER — NA SULFATE-K SULFATE-MG SULF 17.5-3.13-1.6 GM/177ML PO SOLN: 1.0000 | ORAL | 0 refills | Status: DC

## 2019-01-18 NOTE — Patient Instructions (Signed)
Your provider has requested that you go to the basement level for lab work before leaving today. Press "B" on the elevator. The lab is located at the first door on the left as you exit the elevator.  Avoid pepto bismal.   Avoid Lactose in the diet.   We have sent the following medications to your pharmacy for you to pick up at your convenience: Bentyl 20 mg four times a day before meals and at bedtime.   You have been scheduled for an endoscopy and colonoscopy. Please follow the written instructions given to you at your visit today. Please pick up your prep supplies at the pharmacy within the next 1-3 days. If you use inhalers (even only as needed), please bring them with you on the day of your procedure. Your physician has requested that you go to www.startemmi.com and enter the access code given to you at your visit today. This web site gives a general overview about your procedure. However, you should still follow specific instructions given to you by our office regarding your preparation for the procedure.  Tips for colonoscopy:  -STAY WELL HYDRATED FOR 3-4 DAYS PRIOR TO THE EXAM. This reduces nausea and dehydration.  -TO PREVENT SKIN/HEMORRHOID IRRITATION- prior to wiping, put A&Dointment or vaseline on the toilet paper. -Keep a towel or pad on the bed.  -DRINK 64oz of clear liquids in the morning of prep day (PRIOR TO STARTING THE PREP) to be sure that there is enough fluid to flush the colon and stay hydrated!!!! This is in addition to the fluids required for preparation.

## 2019-01-18 NOTE — Progress Notes (Signed)
Referring Provider: Martinique, Betty G, MD Primary Care Physician:  Martinique, Betty G, MD   Reason for Consultation:  Diarrhea   IMPRESSION:  Prandial diarrhea x 4 years with associated bloating    -Occurs within 30 minutes of eating Diabetes  The differential diagnosis of chronic, postprandial diarrhea without alarm features includes: irritable bowel syndrome, IBD, celiac disease, missed infection (such as giardia), SIBO,  food intolerance,microscopic colitis, thyroid disorder, diabetes associated, other functional GI disease. By history, this is less likely to be obstruction.   PLAN: - Fecal calprotectin, ESR, and CRP to screen for IBD - Giardia testing - TSH - IgA tissue transglutaminase, IgA level to test for celiac disease - Avoid PeptoBismal - Avoid lactose in the diet - Trial of Bentyl 20 mg QID - EGD with duodenal biopsies - Colonoscopy with random biopsies and evaluation of the terminal ileum  I consented the patient at the bedside today discussing the risks, benefits, and alternatives to endoscopic evaluation. In particular, we discussed the risks that include, but are not limited to, reaction to medication, cardiopulmonary compromise, bleeding requiring blood transfusion, aspiration resulting in pneumonia, perforation requiring surgery, and even death. We reviewed the risk of missed lesion including polyps or even cancer. The patient acknowledges these risks and asks that we proceed.    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 Martinique for further evaluation of alternating diarrhea and constipation. The history is obtained through the patient and review of his electronic health record. His girlfriend accompanies him to this appointment.  He has type 2 diabetes and hypertension.  Reports a 3-4 year history of post-prandial diarrhea. Watery, loose explosive bowel movements. Alternates with constipation. Occurs within 30 minutes of  eating. Associated bloating.  Some eructation. No distension. Does not eat during the day due to fear for accidents. One nocturnal accident. Drinks Pepto after each BM to control associated cramping. After Pepto the stool will sometimes be hard and pellet like.  No blood or mucous. Cheese thought to be a trigger, but, avoiding cheese hasn't changed his symptoms. Drinks 1-2 carbonated beverages daily. Drink 2-3 caffeinated beverages daily.  Some improvement with  moving towards a plant-based diet.  No noted gluten insensitivity.  No other identified triggers.  Associated sharp, migrating, nonradiating, diffuse abdominal pain prior to defecation that is relieved with defecation. Sense of incomplete evacuation. No history of manual assistance with defecation. Straining when constipation.  Weight stable. No other associated symptoms. No identified exacerbating or relieving features.   No opioids, psyllium, iron, metformin, magnesium, lactulose, sorbitol, multivitamins.  No known family history of colon cancer or polyps. No known GI disease.      Past Medical History:  Diagnosis Date  . Allergy   . Chicken pox   . Depression   . Diabetes mellitus without complication ([place])   . Hypertension     History reviewed. No pertinent surgical history.  Current Outpatient Medications  Medication Sig Dispense Refill  . bismuth subsalicylate (PEPTO BISMOL) 262 MG/15ML suspension Take 30 mLs by mouth every 6 (six) hours as needed.    . Blood Glucose Monitoring Suppl (ONE TOUCH ULTRA 2) w/Device KIT 1 Device by Does not apply route 2 (two) times daily. 1 each 0  . Dulaglutide (TRULICITY) 1.5 WU/9.8JX SOPN INJECT 1.5 MG INTO THE SKIN ONCE A WEEK 13 pen 1  . glucose blood test strip Use as instructed 180 each 3  . Lancets (ONETOUCH ULTRASOFT) lancets  Use as instructed 180 each 3  . omeprazole (PRILOSEC) 40 MG capsule Take 1 capsule (40 mg total) by mouth daily. 90 capsule 1  . pravastatin (PRAVACHOL) 10 MG  tablet Take 1 tablet (10 mg total) by mouth daily. 90 tablet 2  . dicyclomine (BENTYL) 20 MG tablet Take 1 tablet (20 mg total) by mouth 4 (four) times daily -  before meals and at bedtime. 30 tablet 3  . Na Sulfate-K Sulfate-Mg Sulf 17.5-3.13-1.6 GM/177ML SOLN Take 1 kit by mouth as directed for 30 days. 354 mL 0   No current facility-administered medications for this visit.     Allergies as of 01/18/2019  . (No Known Allergies)    Family History  Problem Relation Age of Onset  . Arthritis Mother   . Hyperlipidemia Mother   . Arthritis Father   . Hyperlipidemia Father   . Diabetes Father   . Diabetes Sister   . Diabetes Maternal Grandfather   . Diabetes Paternal Grandfather   . Cancer Neg Hx     Social History   Socioeconomic History  . Marital status: Single    Spouse name: Not on file  . Number of children: Not on file  . Years of education: Not on file  . Highest education level: Not on file  Occupational History  . Not on file  Social Needs  . Financial resource strain: Not on file  . Food insecurity:    Worry: Not on file    Inability: Not on file  . Transportation needs:    Medical: Not on file    Non-medical: Not on file  Tobacco Use  . Smoking status: Former Research scientist (life sciences)  . Smokeless tobacco: Never Used  Substance and Sexual Activity  . Alcohol use: Yes  . Drug use: No  . Sexual activity: Yes    Partners: Female  Lifestyle  . Physical activity:    Days per week: Not on file    Minutes per session: Not on file  . Stress: Not on file  Relationships  . Social connections:    Talks on phone: Not on file    Gets together: Not on file    Attends religious service: Not on file    Active member of club or organization: Not on file    Attends meetings of clubs or organizations: Not on file    Relationship status: Not on file  . Intimate partner violence:    Fear of current or ex partner: Not on file    Emotionally abused: Not on file    Physically abused:  Not on file    Forced sexual activity: Not on file  Other Topics Concern  . Not on file  Social History Narrative  . Not on file    Review of Systems: 12 system ROS is negative except as noted above.  Filed Weights   01/18/19 0921  Weight: 196 lb (88.9 kg)    Physical Exam: Vital signs were reviewed. General:   Alert, well-nourished, pleasant and cooperative in NAD Head:  Normocephalic and atraumatic. Eyes:  Sclera clear, no icterus.   Conjunctiva pink. Mouth:  No deformity or lesions.   Neck:  Supple; no thyromegaly. Lungs:  Clear throughout to auscultation.   No wheezes.  Heart:  Regular rate and rhythm; no murmurs Abdomen:  Soft, nontender, normal bowel sounds. No rebound or guarding. No hepatosplenomegaly Rectal:  Deferred  Msk:  Symmetrical without gross deformities. Extremities:  No gross deformities or edema. Neurologic:  Alert  and  oriented x4;  grossly nonfocal Skin:  No rash or bruise. Psych:  Alert and cooperative. Normal mood and affect.   Chamara Dyck L. Tarri Glenn, MD, MPH [facility] 01/18/2019, 1:09 PM

## 2019-01-19 ENCOUNTER — Other Ambulatory Visit: Payer: BLUE CROSS/BLUE SHIELD

## 2019-01-19 DIAGNOSIS — R197 Diarrhea, unspecified: Secondary | ICD-10-CM | POA: Diagnosis not present

## 2019-01-19 LAB — TISSUE TRANSGLUTAMINASE, IGA: (tTG) Ab, IgA: 1 U/mL

## 2019-01-20 LAB — GIARDIA ANTIGEN
MICRO NUMBER:: 182396
RESULT:: NOT DETECTED
SPECIMEN QUALITY: ADEQUATE

## 2019-01-27 LAB — CALPROTECTIN, FECAL: CALPROTECTIN, FECAL: 110 ug/g (ref 0–120)

## 2019-02-16 ENCOUNTER — Other Ambulatory Visit: Payer: Self-pay

## 2019-02-16 ENCOUNTER — Encounter: Payer: Self-pay | Admitting: Gastroenterology

## 2019-02-16 ENCOUNTER — Ambulatory Visit (AMBULATORY_SURGERY_CENTER): Payer: BLUE CROSS/BLUE SHIELD | Admitting: Gastroenterology

## 2019-02-16 VITALS — BP 119/73 | HR 62 | Temp 98.7°F | Resp 14 | Ht 67.0 in | Wt 196.0 lb

## 2019-02-16 DIAGNOSIS — R197 Diarrhea, unspecified: Secondary | ICD-10-CM

## 2019-02-16 DIAGNOSIS — K5730 Diverticulosis of large intestine without perforation or abscess without bleeding: Secondary | ICD-10-CM

## 2019-02-16 DIAGNOSIS — R140 Abdominal distension (gaseous): Secondary | ICD-10-CM | POA: Diagnosis not present

## 2019-02-16 MED ORDER — SODIUM CHLORIDE 0.9 % IV SOLN: 500.0000 mL | Freq: Once | INTRAVENOUS | Status: DC

## 2019-02-16 NOTE — Progress Notes (Signed)
PT taken to PACU. Monitors in place. VSS. Report given to RN. 

## 2019-02-16 NOTE — Op Note (Signed)
[facility] Patient Name: Mitchell Oneill Procedure Date: 02/16/2019 1:28 PM MRN: [id number] Endoscopist: Tressia Danas MD, MD Age: 43 Referring MD:  Date of Birth: 09-18-1976 Gender: Male Account #: [id number] Procedure:                Upper GI endoscopy Indications:              Abdominal bloating, Diarrhea Medicines:                See the Anesthesia note for documentation of the                            administered medications Procedure:                Pre-Anesthesia Assessment:                           - Prior to the procedure, a History and Physical                            was performed, and patient medications and                            allergies were reviewed. The patient's tolerance of                            previous anesthesia was also reviewed. The risks                            and benefits of the procedure and the sedation                            options and risks were discussed with the patient.                            All questions were answered, and informed consent                            was obtained. Prior Anticoagulants: The patient has                            taken no previous anticoagulant or antiplatelet                            agents. ASA Grade Assessment: II - A patient with                            mild systemic disease. After reviewing the risks                            and benefits, the patient was deemed in                            satisfactory condition to undergo the procedure.  After obtaining informed consent, the endoscope was                            passed under direct vision. Throughout the                            procedure, the patient's blood pressure, pulse, and                            oxygen saturations were monitored continuously. The                            Endoscope was introduced through the mouth, and                            advanced to the second part  of duodenum. The upper                            GI endoscopy was accomplished without difficulty.                            The patient tolerated the procedure well. Scope In: Scope Out: Findings:                 The esophagus was normal.                           The stomach was normal.                           The examined duodenum was normal. Biopsies were                            taken with a cold forceps for histology. Estimated                            blood loss was minimal. Complications:            No immediate complications. Estimated blood loss:                            Minimal. Estimated Blood Loss:     Estimated blood loss was minimal. Impression:               - Normal esophagus.                           - Normal stomach.                           - Normal examined duodenum. Biopsied.                           - No obvious source of symptoms identified on this                            examination. Recommendation:           -  Patient has a contact number available for                            emergencies. The signs and symptoms of potential                            delayed complications were discussed with the                            patient. Return to normal activities tomorrow.                            Written discharge instructions were provided to the                            patient.                           - Resume regular diet today.                           - Continue present medications.                           - Await pathology results.                           - No repeat upper endoscopy planned at this time.                           - Return to GI office to review these results. Tressia Danas MD, MD 02/16/2019 1:57:22 PM This report has been signed electronically.

## 2019-02-16 NOTE — Patient Instructions (Signed)
Await biopsy results  Return to GI clinic to review results   Information on diverticulosis and high fiber diet given to you today  High fiber diet recommended    YOU HAD AN ENDOSCOPIC PROCEDURE TODAY AT THE [facility]:   Refer to the procedure report that was given to you for any specific questions about what was found during the examination.  If the procedure report does not answer your questions, please call your gastroenterologist to clarify.  If you requested that your care partner not be given the details of your procedure findings, then the procedure report has been included in a sealed envelope for you to review at your convenience later.  YOU SHOULD EXPECT: Some feelings of bloating in the abdomen. Passage of more gas than usual.  Walking can help get rid of the air that was put into your GI tract during the procedure and reduce the bloating. If you had a lower endoscopy (such as a colonoscopy or flexible sigmoidoscopy) you may notice spotting of blood in your stool or on the toilet paper. If you underwent a bowel prep for your procedure, you may not have a normal bowel movement for a few days.  Please Note:  You might notice some irritation and congestion in your nose or some drainage.  This is from the oxygen used during your procedure.  There is no need for concern and it should clear up in a day or so.  SYMPTOMS TO REPORT IMMEDIATELY:   Following lower endoscopy (colonoscopy or flexible sigmoidoscopy):  Excessive amounts of blood in the stool  Significant tenderness or worsening of abdominal pains  Swelling of the abdomen that is new, acute  Fever of 100F or higher   Following upper endoscopy (EGD)  Vomiting of blood or coffee ground material  New chest pain or pain under the shoulder blades  Painful or persistently difficult swallowing  New shortness of breath  Fever of 100F or higher  Black, tarry-looking stools  For urgent or emergent issues, a  gastroenterologist can be reached at any hour by calling [phone].   DIET:  We do recommend a small meal at first, but then you may proceed to your regular diet.  Drink plenty of fluids but you should avoid alcoholic beverages for 24 hours.  ACTIVITY:  You should plan to take it easy for the rest of today and you should NOT DRIVE or use heavy machinery until tomorrow (because of the sedation medicines used during the test).    FOLLOW UP: Our staff will call the number listed on your records the next business day following your procedure to check on you and address any questions or concerns that you may have regarding the information given to you following your procedure. If we do not reach you, we will leave a message.  However, if you are feeling well and you are not experiencing any problems, there is no need to return our call.  We will assume that you have returned to your regular daily activities without incident.  If any biopsies were taken you will be contacted by phone or by letter within the next 1-3 weeks.  Please call us at [phone] if you have not heard about the biopsies in 3 weeks.    SIGNATURES/CONFIDENTIALITY: You and/or your care partner have signed paperwork which will be entered into your electronic medical record.  These signatures attest to the fact that that the information above on your After Visit Summary has  been reviewed and is understood.  Full responsibility of the confidentiality of this discharge information lies with you and/or your care-partner.

## 2019-02-16 NOTE — Progress Notes (Signed)
Called to room to assist during endoscopic procedure.  Patient ID and intended procedure confirmed with present staff. Received instructions for my participation in the procedure from the performing physician.  

## 2019-02-16 NOTE — Op Note (Signed)
[facility] Patient Name: Mitchell Oneill Procedure Date: 02/16/2019 1:27 PM MRN: [id number] Endoscopist: Tressia Danas MD, MD Age: 43 Referring MD:  Date of Birth: 1975/12/17 Gender: Male Account #: [id number] Procedure:                Colonoscopy Indications:              Chronic diarrhea Medicines:                See the Anesthesia note for documentation of the                            administered medications Procedure:                Pre-Anesthesia Assessment:                           - Prior to the procedure, a History and Physical                            was performed, and patient medications and                            allergies were reviewed. The patient's tolerance of                            previous anesthesia was also reviewed. The risks                            and benefits of the procedure and the sedation                            options and risks were discussed with the patient.                            All questions were answered, and informed consent                            was obtained. Prior Anticoagulants: The patient has                            taken no previous anticoagulant or antiplatelet                            agents. ASA Grade Assessment: II - A patient with                            mild systemic disease. After reviewing the risks                            and benefits, the patient was deemed in                            satisfactory condition to undergo the procedure.  After obtaining informed consent, the colonoscope                            was passed under direct vision. Throughout the                            procedure, the patient's blood pressure, pulse, and                            oxygen saturations were monitored continuously. The                            Colonoscope was introduced through the anus and                            advanced to the the terminal ileum, with                         identification of the appendiceal orifice and IC                            valve. The colonoscopy was performed without                            difficulty. The patient tolerated the procedure                            well. The quality of the bowel preparation was                            good. The terminal ileum, ileocecal valve,                            appendiceal orifice, and rectum were photographed. Scope In: 1:39:53 PM Scope Out: 1:50:16 PM Scope Withdrawal Time: 0 hours 8 minutes 19 seconds  Total Procedure Duration: 0 hours 10 minutes 23 seconds  Findings:                 The perianal and digital rectal examinations were                            normal.                           The colon (entire examined portion) appeared                            normal. Biopsies for histology were taken with a                            cold forceps from the entire colon for evaluation                            of microscopic colitis. Estimated blood loss was  minimal.                           Multiple small and large-mouthed diverticula were                            found in the sigmoid colon, descending colon,                            transverse colon and ascending colon. There was no                            evidence of diverticular bleeding.                           The exam was otherwise without abnormality on                            direct and retroflexion views. Complications:            No immediate complications. Estimated blood loss:                            Minimal. Estimated Blood Loss:     Estimated blood loss was minimal. Estimated blood                            loss was minimal. Impression:               - The entire examined colon is normal. Biopsied.                           - The examination was otherwise normal on direct                            and retroflexion views.                            - No obvious source of diarrhea identified.                            Awaiting biopsies results to evaluate for                            microscopic colitis. Recommendation:           - Patient has a contact number available for                            emergencies. The signs and symptoms of potential                            delayed complications were discussed with the                            patient. Return to normal activities tomorrow.  Written discharge instructions were provided to the                            patient.                           - Resume previous diet. High fiber diet encouraged.                           - Continue present medications.                           - Await pathology results.                           - Repeat colonoscopy in 10 years for screening                            purposes.                           - Return to GI office to review these results. Tressia Danas MD, MD 02/16/2019 2:01:19 PM This report has been signed electronically.

## 2019-02-17 ENCOUNTER — Telehealth: Payer: Self-pay

## 2019-02-17 NOTE — Telephone Encounter (Signed)
  Follow up Call-  Call back number 02/16/2019  Post procedure Call Back phone  # [phone]  Permission to leave phone message Yes     Patient questions:  Do you have a fever, pain , or abdominal swelling? No. Pain Score  0 *  Have you tolerated food without any problems? Yes.    Have you been able to return to your normal activities? Yes.    Do you have any questions about your discharge instructions: Diet   No. Medications  No. Follow up visit  No.  Do you have questions or concerns about your Care? No.  Actions: * If pain score is 4 or above: No action needed, pain <4.

## 2019-02-22 ENCOUNTER — Encounter: Payer: Self-pay | Admitting: Gastroenterology

## 2019-05-31 ENCOUNTER — Other Ambulatory Visit: Payer: Self-pay | Admitting: Family Medicine

## 2019-05-31 DIAGNOSIS — E1149 Type 2 diabetes mellitus with other diabetic neurological complication: Secondary | ICD-10-CM

## 2019-08-23 DIAGNOSIS — E291 Testicular hypofunction: Secondary | ICD-10-CM | POA: Diagnosis not present

## 2019-08-23 DIAGNOSIS — N4611 Organic oligospermia: Secondary | ICD-10-CM | POA: Diagnosis not present

## 2019-09-06 DIAGNOSIS — E291 Testicular hypofunction: Secondary | ICD-10-CM | POA: Diagnosis not present

## 2019-09-06 DIAGNOSIS — Z3141 Encounter for fertility testing: Secondary | ICD-10-CM | POA: Diagnosis not present

## 2019-10-04 DIAGNOSIS — S83241A Other tear of medial meniscus, current injury, right knee, initial encounter: Secondary | ICD-10-CM | POA: Diagnosis not present

## 2019-11-29 DIAGNOSIS — E291 Testicular hypofunction: Secondary | ICD-10-CM | POA: Diagnosis not present

## 2019-11-29 DIAGNOSIS — Z113 Encounter for screening for infections with a predominantly sexual mode of transmission: Secondary | ICD-10-CM | POA: Diagnosis not present

## 2019-11-29 DIAGNOSIS — Z3184 Encounter for fertility preservation procedure: Secondary | ICD-10-CM | POA: Diagnosis not present

## 2019-11-29 DIAGNOSIS — Z3141 Encounter for fertility testing: Secondary | ICD-10-CM | POA: Diagnosis not present

## 2019-12-13 DIAGNOSIS — Z3141 Encounter for fertility testing: Secondary | ICD-10-CM | POA: Diagnosis not present

## 2019-12-13 DIAGNOSIS — Z3184 Encounter for fertility preservation procedure: Secondary | ICD-10-CM | POA: Diagnosis not present

## 2019-12-20 ENCOUNTER — Telehealth: Payer: Self-pay | Admitting: Family Medicine

## 2019-12-20 ENCOUNTER — Other Ambulatory Visit: Payer: Self-pay

## 2019-12-20 ENCOUNTER — Encounter: Payer: Self-pay | Admitting: Family Medicine

## 2019-12-20 ENCOUNTER — Ambulatory Visit (INDEPENDENT_AMBULATORY_CARE_PROVIDER_SITE_OTHER): Payer: BC Managed Care – PPO | Admitting: Family Medicine

## 2019-12-20 VITALS — BP 120/76 | HR 78 | Temp 96.3°F | Resp 12 | Ht 67.0 in | Wt 205.0 lb

## 2019-12-20 DIAGNOSIS — Z0000 Encounter for general adult medical examination without abnormal findings: Secondary | ICD-10-CM

## 2019-12-20 DIAGNOSIS — Z23 Encounter for immunization: Secondary | ICD-10-CM

## 2019-12-20 DIAGNOSIS — Z13220 Encounter for screening for lipoid disorders: Secondary | ICD-10-CM | POA: Diagnosis not present

## 2019-12-20 DIAGNOSIS — I10 Essential (primary) hypertension: Secondary | ICD-10-CM | POA: Diagnosis not present

## 2019-12-20 DIAGNOSIS — E669 Obesity, unspecified: Secondary | ICD-10-CM | POA: Insufficient documentation

## 2019-12-20 DIAGNOSIS — E6609 Other obesity due to excess calories: Secondary | ICD-10-CM

## 2019-12-20 DIAGNOSIS — L810 Postinflammatory hyperpigmentation: Secondary | ICD-10-CM | POA: Diagnosis not present

## 2019-12-20 DIAGNOSIS — E1149 Type 2 diabetes mellitus with other diabetic neurological complication: Secondary | ICD-10-CM

## 2019-12-20 DIAGNOSIS — Z6832 Body mass index (BMI) 32.0-32.9, adult: Secondary | ICD-10-CM

## 2019-12-20 LAB — COMPREHENSIVE METABOLIC PANEL
ALT: 23 U/L (ref 0–53)
AST: 12 U/L (ref 0–37)
Albumin: 4.3 g/dL (ref 3.5–5.2)
Alkaline Phosphatase: 36 U/L — ABNORMAL LOW (ref 39–117)
BUN: 15 mg/dL (ref 6–23)
CO2: 28 mEq/L (ref 19–32)
Calcium: 9.3 mg/dL (ref 8.4–10.5)
Chloride: 108 mEq/L (ref 96–112)
Creatinine, Ser: 0.84 mg/dL (ref 0.40–1.50)
GFR: 119.89 mL/min (ref >60.00)
Glucose, Bld: 181 mg/dL — ABNORMAL HIGH (ref 70–99)
Potassium: 4.7 mEq/L (ref 3.5–5.1)
Sodium: 143 mEq/L (ref 135–145)
Total Bilirubin: 0.6 mg/dL (ref 0.2–1.2)
Total Protein: 7.1 g/dL (ref 6.0–8.3)

## 2019-12-20 LAB — LIPID PANEL
Cholesterol: 96 mg/dL (ref 0–200)
HDL: 41.6 mg/dL (ref >39.00)
LDL Cholesterol: 39 mg/dL (ref 0–99)
NonHDL: 54.45
Total CHOL/HDL Ratio: 2
Triglycerides: 76 mg/dL (ref 0.0–149.0)
VLDL: 15.2 mg/dL (ref 0.0–40.0)

## 2019-12-20 LAB — MICROALBUMIN / CREATININE URINE RATIO
Creatinine,U: 197.9 mg/dL
Microalb Creat Ratio: 0.5 mg/g (ref 0.0–30.0)
Microalb, Ur: 1 mg/dL (ref 0.0–1.9)

## 2019-12-20 LAB — HEMOGLOBIN A1C: Hgb A1c MFr Bld: 7.2 % — ABNORMAL HIGH (ref 4.6–6.5)

## 2019-12-20 MED ORDER — PRAVASTATIN SODIUM 10 MG PO TABS: 10.0000 mg | ORAL_TABLET | Freq: Every day | ORAL | 3 refills | Status: DC

## 2019-12-20 MED ORDER — TRULICITY 1.5 MG/0.5ML ~~LOC~~ SOAJ: SUBCUTANEOUS | 3 refills | Status: DC

## 2019-12-20 NOTE — Progress Notes (Signed)
HPI:  [name] is a 44 y.o.male here today for his routine physical examination.  Last CPE: 12/14/18. He lives with his girlfriend.  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 Snacks on chocolate or crackers, or potatoes.  Chronic medical problems: IBS,GERD,DM II,HTN, infertility (currently following with urologist), and obesity among some.  Hx of STD's: Negative.  Immunization History  Administered Date(s) Administered  . Influenza,inj,Quad PF,6+ Mos 10/22/2017, 12/14/2018, 12/20/2019  . Pneumococcal Polysaccharide-23 12/14/2018  . Td 01/10/2016  . Tdap 12/10/2015    -Denies high alcohol intake or Hx of illicit drug use. He is vaping. Former smoker.  -Concerns and/or follow up today:   Hypertension: Currently he is on nonpharmacologic treatment. He does not check BP regularly.  DM II:  Lab Results  Component Value Date   HGBA1C 6.4 12/14/2018   Currently he is on Trulicity 1.5 mg weekly. In the past he did not tolerate Metformin, aggravated diarrhea. He has not noted hypoglycemic-like symptoms.  Right foot tingling , stable for years. Denies polydipsia,polyuria, or polyphagia. He is not checking BS's.  Lab Results  Component Value Date   MICROALBUR 1.1 12/14/2018   MICROALBUR <0.7 07/22/2017    Hyperpigmentation of right toes for a while. No other pigmentation changes elsewhere. No pruritus or pain. Negative for numbness, tingling, or burning sensation. No limitation of IP joint movement. No Hx of trauma.  He has an appt with podiatrist in 02/2020.  Review of Systems  Constitutional: Negative for activity change, appetite change, fatigue, fever and unexpected weight change.  HENT: Negative for dental problem, nosebleeds and sore throat.   Eyes: Negative for redness and visual  disturbance.  Respiratory: Negative for apnea, cough, shortness of breath and wheezing.   Cardiovascular: Negative for chest pain, palpitations and leg swelling.  Gastrointestinal: Negative for abdominal pain, blood in stool, nausea and vomiting.  Endocrine: Negative for cold intolerance and heat intolerance.  Genitourinary: Negative for decreased urine volume, dysuria, genital sores, hematuria and testicular pain.  Musculoskeletal: Negative for arthralgias, gait problem and myalgias.  Skin: Negative for color change and rash.  Neurological: Negative for dizziness, syncope, weakness and headaches.  Hematological: Negative for adenopathy. Does not bruise/bleed easily.  Psychiatric/Behavioral: Negative for confusion and sleep disturbance. The patient is not nervous/anxious.    Current Outpatient Medications on File Prior to Visit  Medication Sig Dispense Refill  . bismuth subsalicylate (PEPTO BISMOL) 262 MG/15ML suspension Take 30 mLs by mouth every 6 (six) hours as needed.    . Blood Glucose Monitoring Suppl (ONE TOUCH ULTRA 2) w/Device KIT 1 Device by Does not apply route 2 (two) times daily. 1 each 0  . dicyclomine (BENTYL) 20 MG tablet Take 1 tablet (20 mg total) by mouth 4 (four) times daily -  before meals and at bedtime. 30 tablet 3  . glucose blood test strip Use as instructed 180 each 3  . Lancets (ONETOUCH ULTRASOFT) lancets Use as instructed 180 each 3  . omeprazole (PRILOSEC) 40 MG capsule Take 1 capsule (40 mg total) by mouth daily. 90 capsule 1  . sildenafil (VIAGRA) 100 MG tablet Take 100 mg by mouth as needed.     No current facility-administered medications on file prior to visit.     Past Medical History:  Diagnosis Date  .  Allergy    seasonal  . Chicken pox   . Diabetes mellitus without complication ([place])   . Hypertension     History reviewed. No pertinent surgical history.  No Known Allergies  Family History  Problem Relation Age of Onset  . Arthritis Mother    . Hyperlipidemia Mother   . Arthritis Father   . Hyperlipidemia Father   . Diabetes Father   . Diabetes Sister   . Diabetes Maternal Grandfather   . Diabetes Paternal Grandfather   . Cancer Neg Hx   . Colon cancer Neg Hx   . Colon polyps Neg Hx   . Esophageal cancer Neg Hx   . Rectal cancer Neg Hx   . Stomach cancer Neg Hx     Social History   Socioeconomic History  . Marital status: Single    Spouse name: Not on file  . Number of children: Not on file  . Years of education: Not on file  . Highest education level: Not on file  Occupational History  . Not on file  Tobacco Use  . Smoking status: Former Smoker    Quit date: 2014    Years since quitting: 7.0  . Smokeless tobacco: Never Used  Substance and Sexual Activity  . Alcohol use: Not Currently  . Drug use: Never  . Sexual activity: Yes    Partners: Female  Other Topics Concern  . Not on file  Social History Narrative  . Not on file   Social Determinants of Health   Financial Resource Strain:   . Difficulty of Paying Living Expenses: Not on file  Food Insecurity:   . Worried About Charity fundraiser in the Last Year: Not on file  . Ran Out of Food in the Last Year: Not on file  Transportation Needs:   . Lack of Transportation (Medical): Not on file  . Lack of Transportation (Non-Medical): Not on file  Physical Activity:   . Days of Exercise per Week: Not on file  . Minutes of Exercise per Session: Not on file  Stress:   . Feeling of Stress : Not on file  Social Connections:   . Frequency of Communication with Friends and Family: Not on file  . Frequency of Social Gatherings with Friends and Family: Not on file  . Attends Religious Services: Not on file  . Active Member of Clubs or Organizations: Not on file  . Attends Archivist Meetings: Not on file  . Marital Status: Not on file    Vitals:   12/20/19 0809  BP: 120/76  Pulse: 78  Resp: 12  Temp: (!) 96.3 F (35.7 C)  SpO2: 100%     Body mass index is 32.11 kg/m.   Wt Readings from Last 3 Encounters:  12/20/19 205 lb (93 kg)  02/16/19 196 lb (88.9 kg)  01/18/19 196 lb (88.9 kg)    Physical Exam  Nursing note and vitals reviewed. Constitutional: He is oriented to person, place, and time. He appears well-developed. No distress.  HENT:  Head: Normocephalic and atraumatic.  Right Ear: Tympanic membrane, external ear and ear canal normal.  Left Ear: Tympanic membrane, external ear and ear canal normal.  Mouth/Throat: Oropharynx is clear and moist and mucous membranes are normal.  Eyes: Pupils are equal, round, and reactive to light. Conjunctivae and EOM are normal.  Neck: No tracheal deviation present.  Cardiovascular: Normal rate and regular rhythm.  No murmur heard. Pulses:      Dorsalis pedis  pulses are 2+ on the right side and 2+ on the left side.  Respiratory: Effort normal and breath sounds normal. No respiratory distress.  GI: Soft. He exhibits no mass. There is no hepatomegaly. There is no abdominal tenderness.  Genitourinary:    Genitourinary Comments: Deferred to urologist.   Musculoskeletal:        General: No tenderness or edema.     Cervical back: Normal range of motion.     Comments: No major deformities appreciated and no signs of synovitis.  Lymphadenopathy:    He has no cervical adenopathy.       Right: No supraclavicular adenopathy present.       Left: No supraclavicular adenopathy present.  Neurological: He is alert and oriented to person, place, and time. He has normal strength. No cranial nerve deficit or sensory deficit. Coordination and gait normal.  Reflex Scores:      Bicep reflexes are 2+ on the right side and 2+ on the left side.      Patellar reflexes are 2+ on the right side and 2+ on the left side. Skin: Skin is warm. No erythema.  Hyperpigmentation toes right foot, creases in between IP joints not compromised.  Psychiatric: He has a normal mood and affect. Cognition and  memory are normal.   Diabetic Foot Exam - Simple   Simple Foot Form Diabetic Foot exam was performed with the following findings: Yes 12/20/2019  8:56 AM  Visual Inspection No deformities, no ulcerations, no other skin breakdown bilaterally: Yes Sensation Testing Intact to touch and monofilament testing bilaterally: Yes Pulse Check See comments: Yes Comments Decreased monofilament right foot.     ASSESSMENT AND PLAN:  Mr. Trayson was seen today for annual exam.  Diagnoses and all orders for this visit: Orders Placed This Encounter  Procedures  . Flu Vaccine QUAD 6+ mos PF IM (Fluarix Quad PF)  . Microalbumin / creatinine urine ratio  . Comprehensive metabolic panel  . Lipid panel  . Hemoglobin A1c   Lab Results  Component Value Date   CHOL 96 12/20/2019   HDL 41.60 12/20/2019   LDLCALC 39 12/20/2019   TRIG 76.0 12/20/2019   CHOLHDL 2 12/20/2019    Lab Results  Component Value Date   HGBA1C 7.2 (H) 12/20/2019   Lab Results  Component Value Date   CREATININE 0.84 12/20/2019   BUN 15 12/20/2019   NA 143 12/20/2019   K 4.7 12/20/2019   CL 108 12/20/2019   CO2 28 12/20/2019   Lab Results  Component Value Date   ALT 23 12/20/2019   AST 12 12/20/2019   ALKPHOS 36 (L) 12/20/2019   BILITOT 0.6 12/20/2019   Lab Results  Component Value Date   MICROALBUR 1.0 12/20/2019   Routine general medical examination at a health care facility We discussed the importance of regular physical activity and healthy diet for prevention of chronic illness and/or complications. Preventive guidelines reviewed. Vaccination up-to-date.  Next CPE in a year.  Screening for lipoid disorders -     Lipid panel  Postinflammatory hyperpigmentation ? Residual after sunburn. Could also be related to diabetes.  Hx and findings on examination do not suggest a serious process. Continue monitoring for new associated symptoms.  Need for influenza vaccination -     Flu Vaccine QUAD 6+  mos PF IM (Fluarix Quad PF)  Hypertension, essential, benign BP has been adequately controlled with nonpharmacologic treatment. Continue low-salt diet. Due for eye exam.  Type II diabetes mellitus with  neurological manifestations (HCC) A1c has been at goal. No changes in current management for now, will adjust treatment according to A1c result. Regular exercise and healthy diet with avoidance of added sugar food intake is an important part of treatment and recommended. Annual eye exam (overdue) periodic dental and foot care recommended. F/U in 5-6 months   Class 1 obesity with body mass index (BMI) of 32.0 to 32.9 in adult We discussed benefits of wt loss as well as adverse effects of obesity. Consistency with healthy diet and physical activity recommended.   Return in 6 months (on 06/18/2020) for DM II.    Zaryah Seckel G. [place], MD  [facility]. [place] office.

## 2019-12-20 NOTE — Assessment & Plan Note (Signed)
BP has been adequately controlled with nonpharmacologic treatment. Continue low-salt diet. Due for eye exam.

## 2019-12-20 NOTE — Telephone Encounter (Signed)
Rx's corrected & re-sent.

## 2019-12-20 NOTE — Assessment & Plan Note (Addendum)
A1c has been at goal. No changes in current management for now, will adjust treatment according to A1c result. Regular exercise and healthy diet with avoidance of added sugar food intake is an important part of treatment and recommended. Annual eye exam (overdue) periodic dental and foot care recommended. F/U in 5-6 months

## 2019-12-20 NOTE — Telephone Encounter (Signed)
Pt stated that his pravastatin should be going to the local [facility] on [address] in [place]  [phone] and he needs Trulicity 1.5 MG to go through his Mail order Pharmacy:  [facility].

## 2019-12-20 NOTE — Assessment & Plan Note (Signed)
We discussed benefits of wt loss as well as adverse effects of obesity. Consistency with healthy diet and physical activity recommended.  

## 2019-12-20 NOTE — Patient Instructions (Addendum)
A few things to remember from today's visit:   Routine general medical examination at a health care facility  Type II diabetes mellitus with neurological manifestations (HCC) - Plan: Microalbumin / creatinine urine ratio, Comprehensive metabolic panel, Hemoglobin A1c  Hypertension, essential, benign  Screening for lipoid disorders - Plan: Lipid panel  Postinflammatory hyperpigmentation   At least 150 minutes of moderate exercise per week, daily brisk walking for 15-30 min is a good exercise option. Healthy diet low in saturated (animal) fats and sweets and consisting of fresh fruits and vegetables, lean meats such as fish and white chicken and whole grains.  - Vaccines:  Tdap vaccine every 10 years.  Shingles vaccine recommended at age 65, could be given after 45 years of age but not sure about insurance coverage.  Pneumonia vaccines:  Prevnar 13 at 65 and Pneumovax at 38.   -Screening recommendations for low/normal risk males:  Screening for diabetes N/A  Lipid screening at 35 and every 3 years. Screening starts in younger males with cardiovascular risk factors.  Colon cancer screening at age 8 and until age 85.  Prostate cancer screening: some controversy, starts usually at 50: Rectal exam and PSA.  Aortic Abdominal Aneurism once between 90 and 57 years old if ever smoker.  Also recommended:  1. Dental visit- Brush and floss your teeth twice daily; visit your dentist twice a year. 2. Eye doctor- Get an eye exam at least every 2 years. 3. Helmet use- Always wear a helmet when riding a bicycle, motorcycle, rollerblading or skateboarding. 4. Safe sex- If you may be exposed to sexually transmitted infections, use a condom. 5. Seat belts- Seat belts can save your live; always wear one. 6. Smoke/Carbon Monoxide detectors- These detectors need to be installed on the appropriate level of your home. Replace batteries at least once a year. 7. Skin cancer- When out in the sun  please cover up and use sunscreen 15 SPF or higher. 8. Violence- If anyone is threatening or hurting you, please tell your healthcare provider.  9. Drink alcohol in moderation- Limit alcohol intake to one drink or less per day. Never drink and drive.  Please be sure medication list is accurate. If a new problem present, please set up appointment sooner than planned today.

## 2019-12-22 ENCOUNTER — Other Ambulatory Visit: Payer: Self-pay

## 2019-12-22 MED ORDER — JARDIANCE 10 MG PO TABS: 10.0000 mg | ORAL_TABLET | Freq: Every day | ORAL | 3 refills | Status: DC

## 2020-01-09 ENCOUNTER — Encounter: Payer: Self-pay | Admitting: Family Medicine

## 2020-01-21 ENCOUNTER — Other Ambulatory Visit: Payer: Self-pay | Admitting: Family Medicine

## 2020-01-21 MED ORDER — GLIPIZIDE ER 5 MG PO TB24: 5.0000 mg | ORAL_TABLET | Freq: Every day | ORAL | 3 refills | Status: DC

## 2020-02-07 ENCOUNTER — Ambulatory Visit (INDEPENDENT_AMBULATORY_CARE_PROVIDER_SITE_OTHER): Payer: BC Managed Care – PPO | Admitting: Podiatry

## 2020-02-07 ENCOUNTER — Encounter: Payer: Self-pay | Admitting: Podiatry

## 2020-02-07 ENCOUNTER — Other Ambulatory Visit: Payer: Self-pay

## 2020-02-07 VITALS — BP 122/82 | HR 81 | Temp 96.3°F

## 2020-02-07 DIAGNOSIS — L309 Dermatitis, unspecified: Secondary | ICD-10-CM | POA: Diagnosis not present

## 2020-02-07 MED ORDER — TRIAMCINOLONE ACETONIDE 0.1 % EX OINT: 1.0000 "application " | TOPICAL_OINTMENT | Freq: Two times a day (BID) | CUTANEOUS | 2 refills | Status: DC

## 2020-02-07 NOTE — Patient Instructions (Signed)
Diabetes Mellitus and Foot Care Foot care is an important part of your health, especially when you have diabetes. Diabetes may cause you to have problems because of poor blood flow (circulation) to your feet and legs, which can cause your skin to:  Become thinner and drier.  Break more easily.  Heal more slowly.  Peel and crack. You may also have nerve damage (neuropathy) in your legs and feet, causing decreased feeling in them. This means that you may not notice minor injuries to your feet that could lead to more serious problems. Noticing and addressing any potential problems early is the best way to prevent future foot problems. How to care for your feet Foot hygiene  Wash your feet daily with warm water and mild soap. Do not use hot water. Then, pat your feet and the areas between your toes until they are completely dry. Do not soak your feet as this can dry your skin.  Trim your toenails straight across. Do not dig under them or around the cuticle. File the edges of your nails with an emery board or nail file.  Apply a moisturizing lotion or petroleum jelly to the skin on your feet and to dry, brittle toenails. Use lotion that does not contain alcohol and is unscented. Do not apply lotion between your toes. Shoes and socks  Wear clean socks or stockings every day. Make sure they are not too tight. Do not wear knee-high stockings since they may decrease blood flow to your legs.  Wear shoes that fit properly and have enough cushioning. Always look in your shoes before you put them on to be sure there are no objects inside.  To break in new shoes, wear them for just a few hours a day. This prevents injuries on your feet. Wounds, scrapes, corns, and calluses  Check your feet daily for blisters, cuts, bruises, sores, and redness. If you cannot see the bottom of your feet, use a mirror or ask someone for help.  Do not cut corns or calluses or try to remove them with medicine.  If you  find a minor scrape, cut, or break in the skin on your feet, keep it and the skin around it clean and dry. You may clean these areas with mild soap and water. Do not clean the area with peroxide, alcohol, or iodine.  If you have a wound, scrape, corn, or callus on your foot, look at it several times a day to make sure it is healing and not infected. Check for: ? Redness, swelling, or pain. ? Fluid or blood. ? Warmth. ? Pus or a bad smell. General instructions  Do not cross your legs. This may decrease blood flow to your feet.  Do not use heating pads or hot water bottles on your feet. They may burn your skin. If you have lost feeling in your feet or legs, you may not know this is happening until it is too late.  Protect your feet from hot and cold by wearing shoes, such as at the beach or on hot pavement.  Schedule a complete foot exam at least once a year (annually) or more often if you have foot problems. If you have foot problems, report any cuts, sores, or bruises to your health care provider immediately. Contact a health care provider if:  You have a medical condition that increases your risk of infection and you have any cuts, sores, or bruises on your feet.  You have an injury that is not   healing.  You have redness on your legs or feet.  You feel burning or tingling in your legs or feet.  You have pain or cramps in your legs and feet.  Your legs or feet are numb.  Your feet always feel cold.  You have pain around a toenail. Get help right away if:  You have a wound, scrape, corn, or callus on your foot and: ? You have pain, swelling, or redness that gets worse. ? You have fluid or blood coming from the wound, scrape, corn, or callus. ? Your wound, scrape, corn, or callus feels warm to the touch. ? You have pus or a bad smell coming from the wound, scrape, corn, or callus. ? You have a fever. ? You have a red line going up your leg. Summary  Check your feet every day  for cuts, sores, red spots, swelling, and blisters.  Moisturize feet and legs daily.  Wear shoes that fit properly and have enough cushioning.  If you have foot problems, report any cuts, sores, or bruises to your health care provider immediately.  Schedule a complete foot exam at least once a year (annually) or more often if you have foot problems. This information is not intended to replace advice given to you by your health care provider. Make sure you discuss any questions you have with your health care provider. Document Revised: 08/18/2019 Document Reviewed: 12/27/2016 Elsevier Patient Education  2020 [employer]  

## 2020-02-10 ENCOUNTER — Encounter: Payer: Self-pay | Admitting: Podiatry

## 2020-02-10 NOTE — Progress Notes (Signed)
Subjective: Mitchell Oneill presents today referred by [place], Betty G, MD for complaint of for diabetic foot evaluation.  Patient relates 15 year history of diabetes.  Denies any foot wounds. Has numbness in feet for the past two years, but does not interfere with sleep.  Patient concerned about blister that formed two weeks ago on his left great toe. Initially, it was tender. He does not remember any insect bite nor episode of trauma preceding this. He popped the blister. He states he has blisters regularly, but does not know why.   Secondary concern of darkened thick skin right hallux. This has been present for a few months. He suspects his workboots may be causing this.  Past Medical History:  Diagnosis Date  . Allergy    seasonal  . Chicken pox   . Diabetes mellitus without complication ([place])   . Hypertension      Patient Active Problem List   Diagnosis Date Noted  . Class 1 obesity with body mass index (BMI) of 32.0 to 32.9 in adult 12/20/2019  . GERD (gastroesophageal reflux disease) 10/22/2017  . Type II diabetes mellitus with neurological manifestations ([place]) 07/22/2017  . Hypertension, essential, benign 07/22/2017  . Alternating constipation and diarrhea 07/22/2017     History reviewed. No pertinent surgical history.   Current Outpatient Medications on File Prior to Visit  Medication Sig Dispense Refill  . bismuth subsalicylate (PEPTO BISMOL) 262 MG/15ML suspension Take 30 mLs by mouth every 6 (six) hours as needed.    . Blood Glucose Monitoring Suppl (ONE TOUCH ULTRA 2) w/Device KIT 1 Device by Does not apply route 2 (two) times daily. 1 each 0  . dicyclomine (BENTYL) 20 MG tablet Take 1 tablet (20 mg total) by mouth 4 (four) times daily -  before meals and at bedtime. 30 tablet 3  . Dulaglutide (TRULICITY) 1.5 CH/8.5ID SOPN INJECT 1.5 MG UNDER THE SKIN ONCE A WEEK 6 mL 3  . glipiZIDE (GLUCOTROL XL) 5 MG 24 hr tablet Take 1 tablet (5 mg total) by mouth daily with breakfast.  30 tablet 3  . glucose blood test strip Use as instructed 180 each 3  . Lancets (ONETOUCH ULTRASOFT) lancets Use as instructed 180 each 3  . omeprazole (PRILOSEC) 40 MG capsule Take 1 capsule (40 mg total) by mouth daily. 90 capsule 1  . pravastatin (PRAVACHOL) 10 MG tablet Take 1 tablet (10 mg total) by mouth daily. 90 tablet 3  . sildenafil (VIAGRA) 100 MG tablet Take 100 mg by mouth as needed.     No current facility-administered medications on file prior to visit.     No Known Allergies   Family History  Problem Relation Age of Onset  . Arthritis Mother   . Hyperlipidemia Mother   . Arthritis Father   . Hyperlipidemia Father   . Diabetes Father   . Diabetes Sister   . Diabetes Maternal Grandfather   . Diabetes Paternal Grandfather   . Cancer Neg Hx   . Colon cancer Neg Hx   . Colon polyps Neg Hx   . Esophageal cancer Neg Hx   . Rectal cancer Neg Hx   . Stomach cancer Neg Hx     Social History   Occupational History  . Not on file  Tobacco Use  . Smoking status: Current Every Day Smoker    Types: E-cigarettes  . Smokeless tobacco: Never Used  . Tobacco comment: Vapes daily  Substance and Sexual Activity  . Alcohol use: Not Currently  .  Drug use: Never  . Sexual activity: Yes    Partners: Female    Immunization History  Administered Date(s) Administered  . Influenza,inj,Quad PF,6+ Mos 10/22/2017, 12/14/2018, 12/20/2019  . Pneumococcal Polysaccharide-23 12/14/2018  . Td 01/10/2016  . Tdap 12/10/2015     Objective: Vitals:   02/07/20 1031  BP: 122/82  Pulse: 81  Temp: (!) 96.3 F (35.7 C)    Vascular Examination:  Capillary refill time to digits immediate b/l. Palpable DP pulses b/l. Palpable PT pulses b/l. Pedal hair present b/l. Skin temperature gradient within normal limits b/l.  Dermatological Examination: Pedal skin with normal turgor, texture and tone bilaterally. No open wounds bilaterally. No interdigital macerations bilaterally. Evidence of  remnants of blister left hallux. No erythema, no edema, no drainage, no flocculence.   He also has remnant  hyperpigmentation noted along dorsal aspect of right hallux. No erythema, no edema, no drainage, no flocculence.  His toenails are mildly thickened with some mild discoloration. They have recently been trimmed and there is not enough for me to obtain an adequate nail sample on today's visit.  Musculoskeletal: Normal muscle strength 5/5 to all lower extremity muscle groups bilaterally, no gross bony deformities bilaterally and no pain crepitus or joint limitation noted with ROM b/l  Neurological: Protective sensation intact 5/5 intact bilaterally with 10g monofilament b/l, vibratory sensation intact b/l, proprioception intact bilaterally and Negative Babinski.  Assessment: 1. Eczema, unspecified type     Plan: -Diabetic foot examination performed on today's visit. -Discussed ezcema and treatment options available. -Rx written for Kenalog Ointment 0.1% to be applied to both feet daily.  -I have asked him to let his toenails grow out so I can collect clippings on next visit to assess for onychomycosis. -Discussed diabetic foot care principles. Literature dispensed on today. -Patient to continue soft, supportive shoe gear daily. -Patient to report any pedal injuries to medical professional immediately. -Patient/POA to call should there be question/concern in the interim.  Return in about 3 months (around 05/09/2020) for diabetic nail trim.

## 2020-04-24 LAB — HM DIABETES EYE EXAM

## 2020-04-26 ENCOUNTER — Encounter: Payer: Self-pay | Admitting: Family Medicine

## 2020-05-15 ENCOUNTER — Ambulatory Visit: Payer: BC Managed Care – PPO | Admitting: Podiatry

## 2020-06-19 ENCOUNTER — Ambulatory Visit: Payer: BC Managed Care – PPO | Admitting: Family Medicine

## 2020-07-03 ENCOUNTER — Other Ambulatory Visit: Payer: Self-pay

## 2020-07-03 ENCOUNTER — Encounter: Payer: Self-pay | Admitting: Family Medicine

## 2020-07-03 ENCOUNTER — Ambulatory Visit (INDEPENDENT_AMBULATORY_CARE_PROVIDER_SITE_OTHER): Payer: BC Managed Care – PPO | Admitting: Family Medicine

## 2020-07-03 VITALS — BP 128/80 | HR 98 | Temp 98.3°F | Resp 12 | Ht 67.0 in | Wt 205.0 lb

## 2020-07-03 DIAGNOSIS — E6609 Other obesity due to excess calories: Secondary | ICD-10-CM

## 2020-07-03 DIAGNOSIS — I10 Essential (primary) hypertension: Secondary | ICD-10-CM | POA: Diagnosis not present

## 2020-07-03 DIAGNOSIS — E1149 Type 2 diabetes mellitus with other diabetic neurological complication: Secondary | ICD-10-CM

## 2020-07-03 DIAGNOSIS — K582 Mixed irritable bowel syndrome: Secondary | ICD-10-CM | POA: Diagnosis not present

## 2020-07-03 DIAGNOSIS — L819 Disorder of pigmentation, unspecified: Secondary | ICD-10-CM

## 2020-07-03 DIAGNOSIS — Z6832 Body mass index (BMI) 32.0-32.9, adult: Secondary | ICD-10-CM

## 2020-07-03 LAB — POCT GLYCOSYLATED HEMOGLOBIN (HGB A1C): Hemoglobin A1C: 6.5 % — AB (ref 4.0–5.6)

## 2020-07-03 MED ORDER — GLIPIZIDE ER 5 MG PO TB24: 5.0000 mg | ORAL_TABLET | Freq: Every day | ORAL | 2 refills | Status: DC

## 2020-07-03 MED ORDER — TRULICITY 1.5 MG/0.5ML ~~LOC~~ SOAJ: SUBCUTANEOUS | 3 refills | Status: DC

## 2020-07-03 NOTE — Progress Notes (Signed)
HPI: [name] is a 44 y.o. male, who is here today for 6 months follow up.   He was last seen on 12/20/19.  -DM II:Dx'ed in 2000.  Last HgA1C 7.2 on 12/20/19. Currently he is on glipizide XL 5 mg daily and Trulicity 1.5 mg weekly. He has tolerated medication well. He has not had hypoglycemic events. + Intermittent foot numbness, R>L since 2016,stable.  Negative for polydipsia,polyuria, or polyphagia. He is not exercising regularly, no dietary changes sine his last visit.  He skips lunch frequently, afraid that food intake will exacerbate chronic diarrhea. Reported history of alternating constipation and diarrhea. Constipation is more frequent. He has diarrhea 1 to twice per week. He seems to be exacerbated by fast food. Metformin aggravated problem.  Colonoscopy normal on 02/16/2019. Negative for abnormal weight loss or blood in the stool.  -Hypertension: Currently he is on nonpharmacologic treatment. He is not checking BP at home. Denies severe/frequent headache, visual changes, chest pain, dyspnea, palpitation,focal weakness, or edema.  -Concerned about pigmentation changes of left toenail and skin of right foot. Problems have been going on for years. No tenderness or pruritus. He was evaluated for these problems by podiatrist recently.  Negative for numbness, burning, or tingling.  Review of Systems  Constitutional: Negative for activity change, appetite change, fatigue and fever.  HENT: Negative for mouth sores, nosebleeds and sore throat.   Respiratory: Negative for cough and wheezing.   Gastrointestinal: Negative for abdominal pain, nausea and vomiting.  Genitourinary: Negative for decreased urine volume, dysuria and hematuria.  Musculoskeletal: Negative for gait problem and myalgias.  Skin: Negative for rash and wound.  Neurological: Negative for syncope, facial asymmetry and weakness.  Psychiatric/Behavioral: Negative for confusion.  Rest of ROS, see  pertinent positives sand negatives in HPI  Current Outpatient Medications on File Prior to Visit  Medication Sig Dispense Refill  . bismuth subsalicylate (PEPTO BISMOL) 262 MG/15ML suspension Take 30 mLs by mouth every 6 (six) hours as needed.    . Blood Glucose Monitoring Suppl (ONE TOUCH ULTRA 2) w/Device KIT 1 Device by Does not apply route 2 (two) times daily. 1 each 0  . dicyclomine (BENTYL) 20 MG tablet Take 1 tablet (20 mg total) by mouth 4 (four) times daily -  before meals and at bedtime. 30 tablet 3  . glucose blood test strip Use as instructed 180 each 3  . Lancets (ONETOUCH ULTRASOFT) lancets Use as instructed 180 each 3  . omeprazole (PRILOSEC) 40 MG capsule Take 1 capsule (40 mg total) by mouth daily. 90 capsule 1  . pravastatin (PRAVACHOL) 10 MG tablet Take 1 tablet (10 mg total) by mouth daily. 90 tablet 3  . sildenafil (VIAGRA) 100 MG tablet Take 100 mg by mouth as needed.    . triamcinolone ointment (KENALOG) 0.1 % Apply 1 application topically 2 (two) times daily. 80 g 2   No current facility-administered medications on file prior to visit.   Past Medical History:  Diagnosis Date  . Allergy    seasonal  . Chicken pox   . Diabetes mellitus without complication ([place])   . Hypertension    No Known Allergies  Social History   Socioeconomic History  . Marital status: Single    Spouse name: Not on file  . Number of children: Not on file  . Years of education: Not on file  . Highest education level: Not on file  Occupational History  . Not on file  Tobacco Use  . Smoking  status: Current Every Day Smoker    Types: E-cigarettes  . Smokeless tobacco: Never Used  . Tobacco comment: Vapes daily  Vaping Use  . Vaping Use: Every day  . Substances: Nicotine, Flavoring  Substance and Sexual Activity  . Alcohol use: Not Currently  . Drug use: Never  . Sexual activity: Yes    Partners: Female  Other Topics Concern  . Not on file  Social History Narrative  . Not on  file   Social Determinants of Health   Financial Resource Strain:   . Difficulty of Paying Living Expenses:   Food Insecurity:   . Worried About Charity fundraiser in the Last Year:   . Arboriculturist in the Last Year:   Transportation Needs:   . Film/video editor (Medical):   Marland Kitchen Lack of Transportation (Non-Medical):   Physical Activity:   . Days of Exercise per Week:   . Minutes of Exercise per Session:   Stress:   . Feeling of Stress :   Social Connections:   . Frequency of Communication with Friends and Family:   . Frequency of Social Gatherings with Friends and Family:   . Attends Religious Services:   . Active Member of Clubs or Organizations:   . Attends Archivist Meetings:   Marland Kitchen Marital Status:    Vitals:   07/03/20 1605  BP: 128/80  Pulse: 98  Resp: 12  Temp: 98.3 F (36.8 C)  SpO2: 99%   Body mass index is 32.11 kg/m.  Physical Exam Nursing note reviewed.  Constitutional:      General: He is not in acute distress.    Appearance: He is well-developed.  HENT:     Head: Normocephalic and atraumatic.  Eyes:     Conjunctiva/sclera: Conjunctivae normal.     Pupils: Pupils are equal, round, and reactive to light.  Cardiovascular:     Rate and Rhythm: Normal rate and regular rhythm.     Pulses:          Dorsalis pedis pulses are 2+ on the right side and 2+ on the left side.     Heart sounds: No murmur heard.   Pulmonary:     Effort: Pulmonary effort is normal. No respiratory distress.     Breath sounds: Normal breath sounds.  Abdominal:     Palpations: Abdomen is soft. There is no hepatomegaly or mass.     Tenderness: There is no abdominal tenderness.  Lymphadenopathy:     Cervical: No cervical adenopathy.  Skin:    General: Skin is warm.     Findings: No erythema or rash.     Comments: 5th toenail with even pigmentation changes, no periungueal edema or erythema. Hyperpigmentation changes on dorsum of toes right foot, a few scattered on  some left toes. Mildly thick skin. No ulcers or lesions.   Neurological:     General: No focal deficit present.     Mental Status: He is alert and oriented to person, place, and time.     Cranial Nerves: No cranial nerve deficit.     Gait: Gait normal.  Psychiatric:        Mood and Affect: Affect normal. Mood is anxious.     Comments: Well groomed, good eye contact.    ASSESSMENT AND PLAN:  Mr. Hailey Miles was seen today for 6 months follow-up.  Orders Placed This Encounter  Procedures  . POC HgB A1c   Class 1 obesity with body mass  index (BMI) of 32.0 to 32.9 in adult Wt stable since 12/2019. Encouraged to be consistent with following a healthful diet and regular physical activity.  Type II diabetes mellitus with neurological manifestations ([place]) HgA1C at goal. No changes in current management. Regular exercise and healthy diet with avoidance of added sugar food intake is an important part of treatment and recommended. Annual eye exam and foot care recommended. F/U in 5-6 months   Irritable bowel syndrome with both constipation and diarrhea We discussed dx,prognosis, and treatment options. Problem has been stable. Recommend avoiding identified trigger factors. Adequate hydration. TSH in normal range in 01/2009.  Post-inflammatory pigmentary changes Stable over years. Hx and examination today do not suggest a serious process. Appropriate skin care recommended on monitor for new associated symptoms.  In regard to 5th toenail pigmentation changes, chronic,stable,and asymptomatic; so continue monitoring. Topical Vick Vapor Rub on toenail may help.  Return in about 6 months (around 01/03/2021) for CPE.   Geraldyn Shain G. [place], MD  [facility]. [place] office.   A few things to remember from today's visit:   Type II diabetes mellitus with neurological manifestations ([place]) - Plan: POC HgB A1c  Hypertension, essential, benign  Irritable bowel syndrome with  both constipation and diarrhea  Cook your meals. Drink smoothie or milk shake (Gluceran) for lunch. No changes today.  Diet for Irritable Bowel Syndrome When you have irritable bowel syndrome (IBS), it is very important to eat the foods and follow the eating habits that are best for your condition. IBS may cause various symptoms such as pain in the abdomen, constipation, or diarrhea. Choosing the right foods can help to ease the discomfort from these symptoms. Work with your health care provider and diet and nutrition specialist (dietitian) to find the eating plan that will help to control your symptoms. What are tips for following this plan?      Keep a food diary. This will help you identify foods that cause symptoms. Write down: ? What you eat and when you eat it. ? What symptoms you have. ? When symptoms occur in relation to your meals, such as "pain in abdomen 2 hours after dinner."  Eat your meals slowly and in a relaxed setting.  Aim to eat 5-6 small meals per day. Do not skip meals.  Drink enough fluid to keep your urine pale yellow.  Ask your health care provider if you should take an over-the-counter probiotic to help restore healthy bacteria in your gut (digestive tract). ? Probiotics are foods that contain good bacteria and yeasts.  Your dietitian may have specific dietary recommendations for you based on your symptoms. He or she may recommend that you: ? Avoid foods that cause symptoms. Talk with your dietitian about other ways to get the same nutrients that are in those problem foods. ? Avoid foods with gluten. Gluten is a protein that is found in rye, wheat, and barley. ? Eat more foods that contain soluble fiber. Examples of foods with high soluble fiber include oats, seeds, and certain fruits and vegetables. Take a fiber supplement if directed by your dietitian. ? Reduce or avoid certain foods called FODMAPs. These are foods that contain carbohydrates that are hard to  digest. Ask your doctor which foods contain these carbohydrates. What foods are not recommended? The following are some foods and drinks that may make your symptoms worse:  Fatty foods, such as french fries.  Foods that contain gluten, such as pasta and cereal.  Dairy products, such as  milk, cheese, and ice cream.  Chocolate.  Alcohol.  Products with caffeine, such as coffee.  Carbonated drinks, such as soda.  Foods that are high in FODMAPs. These include certain fruits and vegetables.  Products with sweeteners such as honey, high fructose corn syrup, sorbitol, and mannitol. The items listed above may not be a complete list of foods and beverages you should avoid. Contact a dietitian for more information. What foods are good sources of fiber? Your health care provider or dietitian may recommend that you eat more foods that contain fiber. Fiber can help to reduce constipation and other IBS symptoms. Add foods with fiber to your diet a little at a time so your body can get used to them. Too much fiber at one time might cause gas and swelling of your abdomen. The following are some foods that are good sources of fiber:  Berries, such as raspberries, strawberries, and blueberries.  Tomatoes.  Carrots.  Brown rice.  Oats.  Seeds, such as chia and pumpkin seeds. The items listed above may not be a complete list of recommended sources of fiber. Contact your dietitian for more options. Where to find more information  International Foundation for Functional Gastrointestinal Disorders: www.iffgd.[employer] of Diabetes and Digestive and Kidney Diseases: [employer] Summary  When you have irritable bowel syndrome (IBS), it is very important to eat the foods and follow the eating habits that are best for your condition.  IBS may cause various symptoms such as pain in the abdomen, constipation, or diarrhea.  Choosing the right foods can help to ease the discomfort  that comes from symptoms.  Keep a food diary. This will help you identify foods that cause symptoms.  Your health care provider or diet and nutrition specialist (dietitian) may recommend that you eat more foods that contain fiber. This information is not intended to replace advice given to you by your health care provider. Make sure you discuss any questions you have with your health care provider. Document Revised: 03/17/2019 Document Reviewed: 07/29/2017 Elsevier Patient Education  [employer].  If you need refills please call your pharmacy. Do not use My Chart to request refills or for acute issues that need immediate attention.    Please be sure medication list is accurate. If a new problem present, please set up appointment sooner than planned today.

## 2020-07-03 NOTE — Patient Instructions (Signed)
A few things to remember from today's visit:   Type II diabetes mellitus with neurological manifestations ([facility]) - Plan: POC HgB A1c  Hypertension, essential, benign  Irritable bowel syndrome with both constipation and diarrhea  Cook your meals. Drink smoothie or milk shake (Gluceran) for lunch. No changes today.  Diet for Irritable Bowel Syndrome When you have irritable bowel syndrome (IBS), it is very important to eat the foods and follow the eating habits that are best for your condition. IBS may cause various symptoms such as pain in the abdomen, constipation, or diarrhea. Choosing the right foods can help to ease the discomfort from these symptoms. Work with your health care provider and diet and nutrition specialist (dietitian) to find the eating plan that will help to control your symptoms. What are tips for following this plan?      Keep a food diary. This will help you identify foods that cause symptoms. Write down: ? What you eat and when you eat it. ? What symptoms you have. ? When symptoms occur in relation to your meals, such as "pain in abdomen 2 hours after dinner."  Eat your meals slowly and in a relaxed setting.  Aim to eat 5-6 small meals per day. Do not skip meals.  Drink enough fluid to keep your urine pale yellow.  Ask your health care provider if you should take an over-the-counter probiotic to help restore healthy bacteria in your gut (digestive tract). ? Probiotics are foods that contain good bacteria and yeasts.  Your dietitian may have specific dietary recommendations for you based on your symptoms. He or she may recommend that you: ? Avoid foods that cause symptoms. Talk with your dietitian about other ways to get the same nutrients that are in those problem foods. ? Avoid foods with gluten. Gluten is a protein that is found in rye, wheat, and barley. ? Eat more foods that contain soluble fiber. Examples of foods with high soluble fiber include oats,  seeds, and certain fruits and vegetables. Take a fiber supplement if directed by your dietitian. ? Reduce or avoid certain foods called FODMAPs. These are foods that contain carbohydrates that are hard to digest. Ask your doctor which foods contain these carbohydrates. What foods are not recommended? The following are some foods and drinks that may make your symptoms worse:  Fatty foods, such as french fries.  Foods that contain gluten, such as pasta and cereal.  Dairy products, such as milk, cheese, and ice cream.  Chocolate.  Alcohol.  Products with caffeine, such as coffee.  Carbonated drinks, such as soda.  Foods that are high in FODMAPs. These include certain fruits and vegetables.  Products with sweeteners such as honey, high fructose corn syrup, sorbitol, and mannitol. The items listed above may not be a complete list of foods and beverages you should avoid. Contact a dietitian for more information. What foods are good sources of fiber? Your health care provider or dietitian may recommend that you eat more foods that contain fiber. Fiber can help to reduce constipation and other IBS symptoms. Add foods with fiber to your diet a little at a time so your body can get used to them. Too much fiber at one time might cause gas and swelling of your abdomen. The following are some foods that are good sources of fiber:  Berries, such as raspberries, strawberries, and blueberries.  Tomatoes.  Carrots.  Brown rice.  Oats.  Seeds, such as chia and pumpkin seeds. The items listed above  may not be a complete list of recommended sources of fiber. Contact your dietitian for more options. Where to find more information  International Foundation for Functional Gastrointestinal Disorders: www.iffgd.[employer] of Diabetes and Digestive and Kidney Diseases: [employer] Summary  When you have irritable bowel syndrome (IBS), it is very important to eat the foods and  follow the eating habits that are best for your condition.  IBS may cause various symptoms such as pain in the abdomen, constipation, or diarrhea.  Choosing the right foods can help to ease the discomfort that comes from symptoms.  Keep a food diary. This will help you identify foods that cause symptoms.  Your health care provider or diet and nutrition specialist (dietitian) may recommend that you eat more foods that contain fiber. This information is not intended to replace advice given to you by your health care provider. Make sure you discuss any questions you have with your health care provider. Document Revised: 03/17/2019 Document Reviewed: 07/29/2017 Elsevier Patient Education  [employer].  If you need refills please call your pharmacy. Do not use My Chart to request refills or for acute issues that need immediate attention.    Please be sure medication list is accurate. If a new problem present, please set up appointment sooner than planned today.

## 2020-07-03 NOTE — Assessment & Plan Note (Signed)
Wt stable since 12/2019. Encouraged to be consistent with following a healthful diet and regular physical activity.

## 2020-07-03 NOTE — Assessment & Plan Note (Signed)
We discussed dx,prognosis, and treatment options. Problem has been stable. Recommend avoiding identified trigger factors. Adequate hydration. TSH in normal range in 01/2009.

## 2020-07-03 NOTE — Assessment & Plan Note (Addendum)
HgA1C at goal. No changes in current management. Regular exercise and healthy diet with avoidance of added sugar food intake is an important part of treatment and recommended. Annual eye exam and foot care recommended. F/U in 5-6 months  

## 2020-11-21 ENCOUNTER — Other Ambulatory Visit: Payer: Self-pay | Admitting: Family Medicine

## 2021-01-08 ENCOUNTER — Ambulatory Visit (INDEPENDENT_AMBULATORY_CARE_PROVIDER_SITE_OTHER): Payer: BC Managed Care – PPO | Admitting: Family Medicine

## 2021-01-08 ENCOUNTER — Encounter: Payer: Self-pay | Admitting: Family Medicine

## 2021-01-08 ENCOUNTER — Other Ambulatory Visit: Payer: Self-pay

## 2021-01-08 VITALS — BP 130/80 | HR 96 | Resp 16 | Ht 67.0 in | Wt 209.0 lb

## 2021-01-08 DIAGNOSIS — I10 Essential (primary) hypertension: Secondary | ICD-10-CM | POA: Diagnosis not present

## 2021-01-08 DIAGNOSIS — K582 Mixed irritable bowel syndrome: Secondary | ICD-10-CM

## 2021-01-08 DIAGNOSIS — E1149 Type 2 diabetes mellitus with other diabetic neurological complication: Secondary | ICD-10-CM

## 2021-01-08 DIAGNOSIS — E6609 Other obesity due to excess calories: Secondary | ICD-10-CM

## 2021-01-08 DIAGNOSIS — Z6832 Body mass index (BMI) 32.0-32.9, adult: Secondary | ICD-10-CM

## 2021-01-08 DIAGNOSIS — G629 Polyneuropathy, unspecified: Secondary | ICD-10-CM | POA: Diagnosis not present

## 2021-01-08 LAB — POCT GLYCOSYLATED HEMOGLOBIN (HGB A1C): HbA1c, POC (prediabetic range): 6.3 % (ref 5.7–6.4)

## 2021-01-08 MED ORDER — TRULICITY 1.5 MG/0.5ML ~~LOC~~ SOAJ: SUBCUTANEOUS | 2 refills | Status: DC

## 2021-01-08 MED ORDER — DICYCLOMINE HCL 10 MG PO CAPS: 10.0000 mg | ORAL_CAPSULE | Freq: Three times a day (TID) | ORAL | 1 refills | Status: DC

## 2021-01-08 NOTE — Assessment & Plan Note (Signed)
Educated about Dx,prognosis,and treatment options. He needs to try to identify trigger factors. Bentyl 10 mg tid before meals. Adequate hydration.

## 2021-01-08 NOTE — Assessment & Plan Note (Signed)
Right foot. Educated about importance of appropriate foot care.

## 2021-01-08 NOTE — Progress Notes (Unsigned)
HPI: Mitchell Oneill is a 45 y.o. male, who is here today for follow up.   He was last seen on 07/03/20.  DM II:  Dx'ed in 2000. He is on Trulicity 1.5 mg weekly and Glipizide 5 mg daily.  Lab Results  Component Value Date   HGBA1C 6.5 (A) 07/03/2020   Numbness right toes, occasionally left foot. Problem has been going on since 2016.  Stepped on broken glass a few months ago and did not feel it.  He is taking Glipizide prn, 2-3 times per month, when he feels like his BS is higher and without checking. Negative for polydipsia,polyuria, or polyphagia.  He is not taking Pravastatin 10 mg daily.  HTN: Dx'ed in 2000. He is on non pharmacologic treatment. Negative for severe/frequent headache, visual changes, chest pain, dyspnea, palpitation, focal weakness, or edema.  Lab Results  Component Value Date   CREATININE 0.84 12/20/2019   BUN 15 12/20/2019   NA 143 12/20/2019   K 4.7 12/20/2019   CL 108 12/20/2019   CO2 28 12/20/2019   IBS: Still having episodes of constipation and diarrhea, the former one is more frequent. The days he has diarrhea he has 3-4/day. Frequency of constipation depends of what he eats but not sure about foods that aggravate problem. No associated abdominal pain or vomiting.  He has tried dietary changes , unsuccefully. Occasionally nausea, thinks it is caused by avoiding eating/skipping meals to avoid diarrhea. Negative work-up.  He has seen GI, 01/2019. GI prescribed Bentyl 20 mg, he does not remember trying medication.  Colonoscopy and EGD in 02/2019.  Review of Systems  Constitutional: Negative for activity change, appetite change, fatigue and fever.  HENT: Negative for mouth sores, nosebleeds, sore throat and trouble swallowing.   Respiratory: Negative for cough and wheezing.   Gastrointestinal: Negative for blood in stool.  Genitourinary: Negative for decreased urine volume and hematuria.  Skin: Negative for pallor and rash.   Neurological: Negative for syncope, facial asymmetry and weakness.  Psychiatric/Behavioral: Negative for confusion. The patient is nervous/anxious.   Rest of ROS, see pertinent positives sand negatives in HPI  Current Outpatient Medications on File Prior to Visit  Medication Sig Dispense Refill  . bismuth subsalicylate (PEPTO BISMOL) 262 MG/15ML suspension Take 30 mLs by mouth every 6 (six) hours as needed.    . Blood Glucose Monitoring Suppl (ONE TOUCH ULTRA 2) w/Device KIT 1 Device by Does not apply route 2 (two) times daily. 1 each 0  . glucose blood test strip Use as instructed 180 each 3  . Lancets (ONETOUCH ULTRASOFT) lancets Use as instructed 180 each 3  . omeprazole (PRILOSEC) 40 MG capsule Take 1 capsule (40 mg total) by mouth daily. 90 capsule 1  . pravastatin (PRAVACHOL) 10 MG tablet TAKE 1 TABLET DAILY 90 tablet 3  . sildenafil (VIAGRA) 100 MG tablet Take 100 mg by mouth as needed.    . triamcinolone ointment (KENALOG) 0.1 % Apply 1 application topically 2 (two) times daily. 80 g 2   No current facility-administered medications on file prior to visit.     Past Medical History:  Diagnosis Date  . Allergy    seasonal  . Chicken pox   . Diabetes mellitus without complication ([place])   . Hypertension    No Known Allergies  Social History   Socioeconomic History  . Marital status: Single    Spouse name: Not on file  . Number of children: Not on file  . Years  of education: Not on file  . Highest education level: Not on file  Occupational History  . Not on file  Tobacco Use  . Smoking status: Current Every Day Smoker    Types: E-cigarettes  . Smokeless tobacco: Never Used  . Tobacco comment: Vapes daily  Vaping Use  . Vaping Use: Every day  . Substances: Nicotine, Flavoring  Substance and Sexual Activity  . Alcohol use: Not Currently  . Drug use: Never  . Sexual activity: Yes    Partners: Female  Other Topics Concern  . Not on file  Social History Narrative   . Not on file   Social Determinants of Health   Financial Resource Strain: Not on file  Food Insecurity: Not on file  Transportation Needs: Not on file  Physical Activity: Not on file  Stress: Not on file  Social Connections: Not on file   Vitals:   01/08/21 1511  BP: 130/80  Pulse: 96  Resp: 16  SpO2: 98%   Body mass index is 32.73 kg/m.  Physical Exam Vitals and nursing note reviewed.  Constitutional:      General: He is not in acute distress.    Appearance: He is well-developed.  HENT:     Head: Normocephalic and atraumatic.     Mouth/Throat:     Mouth: Mucous membranes are moist.     Pharynx: Oropharynx is clear.  Eyes:     Conjunctiva/sclera: Conjunctivae normal.  Cardiovascular:     Rate and Rhythm: Normal rate and regular rhythm.     Pulses:          Dorsalis pedis pulses are 2+ on the right side and 2+ on the left side.     Heart sounds: No murmur heard.   Pulmonary:     Effort: Pulmonary effort is normal. No respiratory distress.     Breath sounds: Normal breath sounds.  Abdominal:     Palpations: Abdomen is soft. There is no hepatomegaly or mass.     Tenderness: There is no abdominal tenderness.  Lymphadenopathy:     Cervical: No cervical adenopathy.  Skin:    General: Skin is warm.     Findings: No erythema or rash.  Neurological:     General: No focal deficit present.     Mental Status: He is alert and oriented to person, place, and time.     Cranial Nerves: No cranial nerve deficit.     Gait: Gait normal.  Psychiatric:     Comments: Well groomed, good eye contact.    Diabetic Foot Exam - Simple   Simple Foot Form Diabetic Foot exam was performed with the following findings: Yes 01/08/2021  4:20 PM  Visual Inspection No deformities, no ulcerations, no other skin breakdown bilaterally: Yes Sensation Testing See comments: Yes Pulse Check Posterior Tibialis and Dorsalis pulse intact bilaterally: Yes Comments Decreased monofilament  2-3-4-5th right toes.    ASSESSMENT AND PLAN:  [name] was seen today for follow-up.  Orders Placed This Encounter  Procedures  . Microalbumin / creatinine urine ratio  . Comprehensive metabolic panel  . Fructosamine  . POC HgB A1c   Lab Results  Component Value Date   HGBA1C 6.3 01/08/2021   Lab Results  Component Value Date   MICROALBUR <0.7 01/08/2021   MICROALBUR 1.0 12/20/2019   Lab Results  Component Value Date   CREATININE 0.77 01/08/2021   BUN 11 01/08/2021   NA 141 01/08/2021   K 4.4 01/08/2021   CL  105 01/08/2021   CO2 30 01/08/2021   Lab Results  Component Value Date   ALT 17 01/08/2021   AST 13 01/08/2021   ALKPHOS 42 01/08/2021   BILITOT 0.9 01/08/2021    Type II diabetes mellitus with neurological manifestations ([place]) HgA1C at goal. He is not taking Glipizide daily, so recommend discontinuing medication. No changes in Trulicity. Regular exercise and healthy diet with avoidance of added sugar food intake is an important part of treatment and recommended. Annual eye exam, periodic dental and foot care recommended. F/U in 4 months   Irritable bowel syndrome with both constipation and diarrhea Educated about Dx,prognosis,and treatment options. He needs to try to identify trigger factors. Bentyl 10 mg tid before meals. Adequate hydration.  Hypertension, essential, benign Problem is well controlled with non pharmacologic treatment. Low salt diet. Eye exam in current.  Peripheral neuropathy Right foot. Educated about importance of appropriate foot care.  Class 1 obesity with body mass index (BMI) of 32.0 to 32.9 in adult He understands benefits of wt loss as well as adverse effects of obesity. Consistency with healthy diet and physical activity recommended.   Spent 42 minutes.  During this time history was obtained and documented, examination was performed, prior labs reviewed, and assessment/plan discussed. Great amount of time  dedicated to IBS discussion.  Return in about 4 months (around 05/08/2021) for DM II.   Mitchell Oneill G. Martinique, MD  [facility]. [place] office.    A few things to remember from today's visit:   If you need refills please call your pharmacy. Do not use My Chart to request refills or for acute issues that need immediate attention.   Glipizide to discontinued today. No changes in trulicity. Resume Pravastatin.  Dieta para el sndrome de colon irritable Diet for Irritable Bowel Syndrome Cuando se tiene sndrome de colon irritable (SCI), es muy importante consumir los alimentos y seguir los hbitos alimenticios que sean mejores para la afeccin. El SCI puede causar varios sntomas, Adult nurse en el abdomen, estreimiento o diarrea. Elegir los alimentos adecuados puede ayudar a Public house manager las molestias causadas por estos sntomas. Colabore con el mdico y Human resources officer en alimentacin y nutricin (nutricionista) para Pension scheme manager un plan de alimentacin que lo ayude a Chief Technology Officer los sntomas. Consejos para seguir [employer] un registro de los [employer] come. Esto ayudar a Engineer, materials causan sntomas. Escriba los siguientes datos: ? Qu come y cundo lo come. ? Qu sntomas tiene. ? Cundo ocurren los sntomas en relacin con las comidas; por ejemplo, "dolor en el abdomen 2 horas despus de la cena".  Coma lentamente y en un ambiente distendido.  Intente consumir 5 o 6comidas pequeas por da. No omita comidas.  Beber suficiente lquido como para Theatre manager la orina de color amarillo plido.  Pregntele al mdico si debe tomar un probitico de venta libre para ayudar a recuperar las bacterias beneficiosas en el intestino (tubo digestivo). ? Los probiticos son alimentos que contienen bacterias y levaduras beneficiosas.  El nutricionista puede hacerle recomendaciones alimentarias especficas dependiendo de los sntomas que [place]. El mdico puede  recomendarle lo siguiente: ? Evite los alimentos que le ocasionen sntomas. Hable con el nutricionista sobre otras formas de [employer] mismos nutrientes que contienen estos alimentos problemticos. ? Evite los alimentos con gluten. El gluten es una protena que se encuentra en el centeno, el trigo y la [place]. ? Consuma ms alimentos con fibra soluble. Algunos alimentos con alto contenido de [place]  soluble son la avena, las semillas y ciertas frutas y verduras. Tome un suplemento de fibra si se lo indic el nutricionista. ? Reduzca o evite ciertos alimentos que reciben el nombre de FODMAP (oligosacridos, disacridos, monosacridos y polioles fermentables). Se trata de alimentos que contienen carbohidratos difciles de digerir. Pregunte al mdico cules alimentos contienen estos carbohidratos.      Qu alimentos no se recomiendan? Los siguientes son algunos alimentos y bebidas que pueden hacer que los sntomas empeoren:  Alimentos grasos, como las papas fritas.  Alimentos que contienen gluten, como las pastas y los cereales.  Productos lcteos, como la [place], el queso y [place].  Chocolate.  Alcohol.  Productos que contengan cafena, [place].  Bebidas gaseosas, como refrescos.  Alimentos con alto contenido de FODMAP. Algunos de estos alimentos son las frutas y las verduras.  Productos con endulzantes tales como la miel, el jarabe de maz de alta fructosa, el sorbitol y el manitol. Es posible que los productos que se enumeran ms [place] no constituyan una lista completa de los alimentos y las bebidas que Nurse, adult. Consulte a un nutricionista para obtener ms informacin.   Qu alimentos son buenas fuentes de [place]? El mdico o el nutricionista pueden recomendarle que consuma ms alimentos que [place]. La fibra puede ayudar a Theatre stage manager estreimiento y 9 sntomas del SCI. Poco a poco, incorpore a la dieta alimentos que [employer] para que el organismo pueda  acostumbrarse a ellos. El exceso de [place] de una vez puede causar gases e hinchazn en el abdomen. Los siguientes son algunos alimentos que son buenas fuentes de fibra:  Los frutos rojos, como las frambuesas, las fresas y [place].  Tomates.  Zanahorias.  Arroz integral.  Lyndle Herrlich.  Las semillas, como las de cha y Scientific laboratory technician. Es posible que los alimentos mencionados anteriormente no conformen una lista completa de fuentes de [place] recomendadas. Comunquese con el nutricionista para conocer ms opciones. Dnde buscar ms informacin  Fundacin Internacional para los Energy manager Funcionales [employer] for Functional Gastrointestinal Disorders): www.iffgd.[employer] of Diabetes and Digestive and Kidney Diseases ([place] Diabetes y [place] y Renales): [employer]Funeral.dk Resumen  Cuando se tiene sndrome de colon irritable (SCI), es muy importante consumir los alimentos y seguir los hbitos alimenticios que sean mejores para la afeccin.  El SCI puede causar varios sntomas, Adult nurse en el abdomen, estreimiento o diarrea.  Elegir los alimentos adecuados puede ayudar a Public house manager las molestias causadas por los sntomas.  Lleve un registro de los [employer] come. Esto ayudar a Engineer, materials causan sntomas.  El mdico o Human resources officer en alimentacin y nutricin (nutricionista) pueden recomendarle que consuma ms alimentos que contengan [place]. Esta informacin no tiene Marine scientist el consejo del mdico. Asegrese de hacerle al mdico cualquier pregunta que tenga. Document Revised: 09/21/2020 Document Reviewed: 09/21/2020 Elsevier Patient Education  2021 [place].  Please be sure medication list is accurate. If a new problem present, please set up appointment sooner than planned today.

## 2021-01-08 NOTE — Assessment & Plan Note (Signed)
HgA1C at goal. He is not taking Glipizide daily, so recommend discontinuing medication. No changes in Trulicity. Regular exercise and healthy diet with avoidance of added sugar food intake is an important part of treatment and recommended. Annual eye exam, periodic dental and foot care recommended. F/U in 4 months

## 2021-01-08 NOTE — Assessment & Plan Note (Signed)
Problem is well controlled with non pharmacologic treatment. Low salt diet. Eye exam in current.

## 2021-01-08 NOTE — Patient Instructions (Addendum)
A few things to remember from today's visit:   If you need refills please call your pharmacy. Do not use My Chart to request refills or for acute issues that need immediate attention.   Glipizide to discontinued today. No changes in trulicity. Resume Pravastatin.  Dieta para el sndrome de colon irritable Diet for Irritable Bowel Syndrome Cuando se tiene sndrome de colon irritable (SCI), es muy importante consumir los alimentos y seguir los hbitos alimenticios que sean mejores para la afeccin. El SCI puede causar varios sntomas, Education officer, environmental en el abdomen, estreimiento o diarrea. Elegir los alimentos adecuados puede ayudar a Paramedic las molestias causadas por estos sntomas. Colabore con el mdico y Pensions consultant en alimentacin y nutricin (nutricionista) para Clinical research associate un plan de alimentacin que lo ayude a Chief Operating Officer los sntomas. Consejos para seguir [employer] un registro de los [employer] come. Esto ayudar a Orthoptist causan sntomas. Escriba los siguientes datos: ? Qu come y cundo lo come. ? Qu sntomas tiene. ? Cundo ocurren los sntomas en relacin con las comidas; por ejemplo, "dolor en el abdomen 2 horas despus de la cena".  Coma lentamente y en un ambiente distendido.  Intente consumir 5 o 6comidas pequeas por da. No omita comidas.  Beber suficiente lquido como para Pharmacologist la orina de color amarillo plido.  Pregntele al mdico si debe tomar un probitico de venta libre para ayudar a recuperar las bacterias beneficiosas en el intestino (tubo digestivo). ? Los probiticos son alimentos que contienen bacterias y levaduras beneficiosas.  El nutricionista puede hacerle recomendaciones alimentarias especficas dependiendo de los sntomas que [place]. El mdico puede recomendarle lo siguiente: ? Evite los alimentos que le ocasionen sntomas. Hable con el nutricionista sobre otras formas de [employer] mismos nutrientes que  contienen estos alimentos problemticos. ? Evite los alimentos con gluten. El gluten es una protena que se encuentra en el centeno, el trigo y la [place]. ? Consuma ms alimentos con fibra soluble. Algunos alimentos con alto contenido de [place] soluble son la avena, las semillas y ciertas frutas y verduras. Tome un suplemento de fibra si se lo indic el nutricionista. ? Reduzca o evite ciertos alimentos que reciben el nombre de FODMAP (oligosacridos, disacridos, monosacridos y polioles fermentables). Se trata de alimentos que contienen carbohidratos difciles de digerir. Pregunte al mdico cules alimentos contienen estos carbohidratos.      Qu alimentos no se recomiendan? Los siguientes son algunos alimentos y bebidas que pueden hacer que los sntomas empeoren:  Alimentos grasos, como las papas fritas.  Alimentos que contienen gluten, como las pastas y los cereales.  Productos lcteos, como la [place], el queso y [place].  Chocolate.  Alcohol.  Productos que contengan cafena, [place].  Bebidas gaseosas, como refrescos.  Alimentos con alto contenido de FODMAP. Algunos de estos alimentos son las frutas y las verduras.  Productos con endulzantes tales como la miel, el jarabe de maz de alta fructosa, el sorbitol y el manitol. Es posible que los productos que se enumeran ms [place] no constituyan una lista completa de los alimentos y las bebidas que Personnel officer. Consulte a un nutricionista para obtener ms informacin.   Qu alimentos son buenas fuentes de [place]? El mdico o el nutricionista pueden recomendarle que consuma ms alimentos que [place]. La fibra puede ayudar a Acupuncturist estreimiento y otros sntomas del SCI. Poco a poco, incorpore a la dieta alimentos que [employer] para que el organismo pueda acostumbrarse a ellos. El  exceso de fibra de una vez puede causar gases e hinchazn en el abdomen. Los siguientes son algunos alimentos que son buenas fuentes de  fibra:  Los frutos rojos, como las frambuesas, las fresas y [place].  Tomates.  Zanahorias.  Arroz integral.  [place].  Las semillas, como las de cha y [place]. Es posible que los alimentos mencionados anteriormente no conformen una lista completa de fuentes de [place] recomendadas. Comunquese con el nutricionista para conocer ms opciones. Dnde buscar ms informacin  Fundacin Internacional para los Engineer, production Funcionales [employer] for Functional Gastrointestinal Disorders): www.iffgd.[employer] Corporation of Diabetes and Digestive and Kidney Diseases [employer] de la Diabetes y las Enfermedades Digestivas y Renales): CarFlippers.tn Resumen  Cuando se tiene sndrome de colon irritable (SCI), es muy importante consumir los alimentos y seguir los hbitos alimenticios que sean mejores para la afeccin.  El SCI puede causar varios sntomas, Education officer, environmental en el abdomen, estreimiento o diarrea.  Elegir los alimentos adecuados puede ayudar a Paramedic las molestias causadas por los sntomas.  Lleve un registro de los [employer] come. Esto ayudar a Orthoptist causan sntomas.  El mdico o Pensions consultant en alimentacin y nutricin (nutricionista) pueden recomendarle que consuma ms alimentos que contengan [place]. Esta informacin no tiene Theme park manager el consejo del mdico. Asegrese de hacerle al mdico cualquier pregunta que tenga. Document Revised: 09/21/2020 Document Reviewed: 09/21/2020 Elsevier Patient Education  2021 Elsevier Inc.  Please be sure medication list is accurate. If a new problem present, please set up appointment sooner than planned today.

## 2021-01-08 NOTE — Assessment & Plan Note (Signed)
He understands benefits of wt loss as well as adverse effects of obesity. Consistency with healthy diet and physical activity recommended.

## 2021-01-09 LAB — COMPREHENSIVE METABOLIC PANEL
ALT: 17 U/L (ref 0–53)
AST: 13 U/L (ref 0–37)
Albumin: 4.4 g/dL (ref 3.5–5.2)
Alkaline Phosphatase: 42 U/L (ref 39–117)
BUN: 11 mg/dL (ref 6–23)
CO2: 30 mEq/L (ref 19–32)
Calcium: 10 mg/dL (ref 8.4–10.5)
Chloride: 105 mEq/L (ref 96–112)
Creatinine, Ser: 0.77 mg/dL (ref 0.40–1.50)
GFR: 108.16 mL/min (ref >60.00)
Glucose, Bld: 107 mg/dL — ABNORMAL HIGH (ref 70–99)
Potassium: 4.4 mEq/L (ref 3.5–5.1)
Sodium: 141 mEq/L (ref 135–145)
Total Bilirubin: 0.9 mg/dL (ref 0.2–1.2)
Total Protein: 7.2 g/dL (ref 6.0–8.3)

## 2021-01-09 LAB — MICROALBUMIN / CREATININE URINE RATIO
Creatinine,U: 146.3 mg/dL
Microalb Creat Ratio: 0.5 mg/g (ref 0.0–30.0)
Microalb, Ur: <0.7 mg/dL (ref 0.0–1.9)

## 2021-01-10 LAB — FRUCTOSAMINE: Fructosamine: 279 umol/L (ref 205–285)

## 2021-02-01 ENCOUNTER — Other Ambulatory Visit: Payer: Self-pay | Admitting: Family Medicine

## 2021-02-28 ENCOUNTER — Other Ambulatory Visit: Payer: Self-pay | Admitting: Family Medicine

## 2021-04-29 NOTE — Progress Notes (Deleted)
HPI:  [name] is a 45 y.o. male, who is here today for *** months follow up.   He was last seen on 01/08/21.  {4+ HPI elements (or status of 3 or more chronic diseases)} *** DM II: Dx'ed in 2000. He is on Glipizide XL 5 mg daily and Trulicity 1.5 mg daily. Foot numbness intermittently since 2016.  *** Lab Results  Component Value Date   HGBA1C 6.3 01/08/2021   Lab Results  Component Value Date   MICROALBUR <0.7 01/08/2021   MICROALBUR 1.0 12/20/2019   HTN on non pharmacologic treatment.  Lab Results  Component Value Date   CREATININE 0.77 01/08/2021   BUN 11 01/08/2021   NA 141 01/08/2021   K 4.4 01/08/2021   CL 105 01/08/2021   CO2 30 01/08/2021    Review of Systems Rest of ROS, see pertinent positives sand negatives in HPI [review of 2 to 9 systems] ***  Current Outpatient Medications on File Prior to Visit  Medication Sig Dispense Refill  . bismuth subsalicylate (PEPTO BISMOL) 262 MG/15ML suspension Take 30 mLs by mouth every 6 (six) hours as needed.    . Blood Glucose Monitoring Suppl (ONE TOUCH ULTRA 2) w/Device KIT 1 Device by Does not apply route 2 (two) times daily. 1 each 0  . dicyclomine (BENTYL) 10 MG capsule TAKE 1 CAPSULE (10 MG TOTAL) BY MOUTH 3 (THREE) TIMES DAILY BEFORE MEALS. 90 capsule 2  . Dulaglutide (TRULICITY) 1.5 PQ/3.3AQ SOPN INJECT 1.5 MG UNDER THE SKIN ONCE A WEEK 9 mL 2  . glucose blood test strip Use as instructed 180 each 3  . Lancets (ONETOUCH ULTRASOFT) lancets Use as instructed 180 each 3  . omeprazole (PRILOSEC) 40 MG capsule Take 1 capsule (40 mg total) by mouth daily. 90 capsule 1  . pravastatin (PRAVACHOL) 10 MG tablet TAKE 1 TABLET DAILY 90 tablet 3  . sildenafil (VIAGRA) 100 MG tablet Take 100 mg by mouth as needed.    . triamcinolone ointment (KENALOG) 0.1 % Apply 1 application topically 2 (two) times daily. 80 g 2   No current facility-administered medications on file prior to visit.    Past Medical History:   Diagnosis Date  . Allergy    seasonal  . Chicken pox   . Diabetes mellitus without complication ([place])   . Hypertension    No Known Allergies  Social History   Socioeconomic History  . Marital status: Single    Spouse name: Not on file  . Number of children: Not on file  . Years of education: Not on file  . Highest education level: Not on file  Occupational History  . Not on file  Tobacco Use  . Smoking status: Current Every Day Smoker    Types: E-cigarettes  . Smokeless tobacco: Never Used  . Tobacco comment: Vapes daily  Vaping Use  . Vaping Use: Every day  . Substances: Nicotine, Flavoring  Substance and Sexual Activity  . Alcohol use: Not Currently  . Drug use: Never  . Sexual activity: Yes    Partners: Female  Other Topics Concern  . Not on file  Social History Narrative  . Not on file   Social Determinants of Health   Financial Resource Strain: Not on file  Food Insecurity: Not on file  Transportation Needs: Not on file  Physical Activity: Not on file  Stress: Not on file  Social Connections: Not on file   There were no vitals filed for this visit.  Wt Readings from Last 3 Encounters:  01/08/21 209 lb (94.8 kg)  07/03/20 (!) 205 lb (93 kg)  12/20/19 205 lb (93 kg)   There is no height or weight on file to calculate BMI.  Physical Exam  {[12+ exam elements]} ***  ASSESSMENT AND PLAN:   [name] was seen today for *** months follow-up.  No orders of the defined types were placed in this encounter.   No problem-specific Assessment & Plan notes found for this encounter.    No follow-ups on file.        -[name] was advised to return sooner than planned today if new concerns arise.       Betty G. Martinique, MD  [facility] [place] office.

## 2021-04-30 ENCOUNTER — Ambulatory Visit: Payer: BC Managed Care – PPO | Admitting: Family Medicine

## 2021-04-30 DIAGNOSIS — G629 Polyneuropathy, unspecified: Secondary | ICD-10-CM

## 2021-04-30 DIAGNOSIS — Z1159 Encounter for screening for other viral diseases: Secondary | ICD-10-CM

## 2021-04-30 DIAGNOSIS — E1149 Type 2 diabetes mellitus with other diabetic neurological complication: Secondary | ICD-10-CM

## 2021-04-30 DIAGNOSIS — E6609 Other obesity due to excess calories: Secondary | ICD-10-CM

## 2021-04-30 DIAGNOSIS — I10 Essential (primary) hypertension: Secondary | ICD-10-CM

## 2021-05-14 LAB — HM DIABETES EYE EXAM

## 2021-05-15 ENCOUNTER — Encounter: Payer: Self-pay | Admitting: Family Medicine

## 2021-05-21 ENCOUNTER — Encounter: Payer: Self-pay | Admitting: Family Medicine

## 2021-05-21 ENCOUNTER — Ambulatory Visit (INDEPENDENT_AMBULATORY_CARE_PROVIDER_SITE_OTHER): Payer: BC Managed Care – PPO | Admitting: Family Medicine

## 2021-05-21 ENCOUNTER — Other Ambulatory Visit: Payer: Self-pay

## 2021-05-21 VITALS — BP 128/90 | HR 96 | Resp 16 | Ht 67.0 in | Wt 202.0 lb

## 2021-05-21 DIAGNOSIS — R42 Dizziness and giddiness: Secondary | ICD-10-CM | POA: Diagnosis not present

## 2021-05-21 DIAGNOSIS — E6609 Other obesity due to excess calories: Secondary | ICD-10-CM

## 2021-05-21 DIAGNOSIS — Z6831 Body mass index (BMI) 31.0-31.9, adult: Secondary | ICD-10-CM

## 2021-05-21 DIAGNOSIS — K582 Mixed irritable bowel syndrome: Secondary | ICD-10-CM | POA: Diagnosis not present

## 2021-05-21 DIAGNOSIS — E1149 Type 2 diabetes mellitus with other diabetic neurological complication: Secondary | ICD-10-CM

## 2021-05-21 DIAGNOSIS — I10 Essential (primary) hypertension: Secondary | ICD-10-CM

## 2021-05-21 LAB — POCT GLYCOSYLATED HEMOGLOBIN (HGB A1C): HbA1c, POC (controlled diabetic range): 6.9 % (ref 0.0–7.0)

## 2021-05-21 NOTE — Patient Instructions (Signed)
A few things to remember from today's visit:   Type II diabetes mellitus with neurological manifestations (HCC) - Plan: POC HgB A1c  Hypertension, essential, benign - Plan: Basic metabolic panel, CBC  Irritable bowel syndrome with both constipation and diarrhea  Dizziness - Plan: Basic metabolic panel, CBC  If you need refills please call your pharmacy. Do not use My Chart to request refills or for acute issues that need immediate attention. Take bentyl before breakfast and lunch. Adequate hydration and move slower when working outdoors. No changes in medications. Monitor blood pressure and stop snacking on chocolates.  Please be sure medication list is accurate. If a new problem present, please set up appointment sooner than planned today.

## 2021-05-21 NOTE — Progress Notes (Signed)
HPI: [name] is a 45 y.o. male, who is here today for 6 months follow up.   He was last seen on 01/08/21.  DM II: Dx'ed in 2000. He is on Glipizide XL 5 mg daily and Trulicity 1.5 mg weekly.  He is not checking BS's. He is eating chocolate mini bars 4 at bedtime. Denies abdominal pain, nausea,vomiting, polydipsia,polyuria, or polyphagia.  HgA1C 6.3 in 12/2020. Lab Results  Component Value Date   MICROALBUR <0.7 01/08/2021   MICROALBUR 1.0 12/20/2019   Eye exam on 05/14/21, ? Of glaucoma. Recommend annual follow ups. He is not exercise regularly, he is very active at work, outdoors.  Chronic diarrhea:He is taking bentyl after meal, still helping dome. Skipping lunch because afraid of having symptoms while working. Colonoscopy on 02/16/19.  Dizziness: When working outdoors with hot weather and getting up fast. It can last 2-3 min. It is not spinning, cannot explained sensation. Problem has been intermittently for a while. Last episode 3 weeks ago. No associated symptoms.  HTN: He is on non pharmacologic treatment. He is not checking BP at home. Negative for severe/frequent headache, chest pain, dyspnea, palpitation, focal weakness, or edema.  Component     Latest Ref Rng & Units 01/08/2021  Sodium     135 - 145 mEq/L 141  Potassium     3.5 - 5.1 mEq/L 4.4  Chloride     96 - 112 mEq/L 105  CO2     19 - 32 mEq/L 30  Glucose     70 - 99 mg/dL 107 (H)  BUN     6 - 23 mg/dL 11  Creatinine     0.40 - 1.50 mg/dL 0.77    Review of Systems  Constitutional:  Negative for activity change, appetite change, fatigue and fever.  HENT:  Negative for ear pain, hearing loss, mouth sores, nosebleeds and sore throat.   Eyes:  Negative for redness and visual disturbance.  Respiratory:  Negative for cough and wheezing.   Genitourinary:  Negative for decreased urine volume, dysuria and hematuria.  Musculoskeletal:  Negative for gait problem and myalgias.  Skin:  Negative  for pallor and rash.  Neurological:  Negative for syncope, facial asymmetry and weakness.  Rest of ROS, see pertinent positives sand negatives in HPI  Current Outpatient Medications on File Prior to Visit  Medication Sig Dispense Refill   bismuth subsalicylate (PEPTO BISMOL) 262 MG/15ML suspension Take 30 mLs by mouth every 6 (six) hours as needed.     Blood Glucose Monitoring Suppl (ONE TOUCH ULTRA 2) w/Device KIT 1 Device by Does not apply route 2 (two) times daily. 1 each 0   dicyclomine (BENTYL) 10 MG capsule TAKE 1 CAPSULE (10 MG TOTAL) BY MOUTH 3 (THREE) TIMES DAILY BEFORE MEALS. 90 capsule 2   Dulaglutide (TRULICITY) 1.5 GE/3.6OQ SOPN INJECT 1.5 MG UNDER THE SKIN ONCE A WEEK 9 mL 2   glipiZIDE (GLUCOTROL XL) 5 MG 24 hr tablet Take 5 mg by mouth daily.     glucose blood test strip Use as instructed 180 each 3   Lancets (ONETOUCH ULTRASOFT) lancets Use as instructed 180 each 3   omeprazole (PRILOSEC) 40 MG capsule Take 1 capsule (40 mg total) by mouth daily. 90 capsule 1   pravastatin (PRAVACHOL) 10 MG tablet TAKE 1 TABLET DAILY 90 tablet 3   sildenafil (VIAGRA) 100 MG tablet Take 100 mg by mouth as needed.     triamcinolone ointment (KENALOG) 0.1 % Apply 1  application topically 2 (two) times daily. 80 g 2   No current facility-administered medications on file prior to visit.   Past Medical History:  Diagnosis Date   Allergy    seasonal   Chicken pox    Diabetes mellitus without complication ([place])    Hypertension    No Known Allergies  Social History   Socioeconomic History   Marital status: Single    Spouse name: Not on file   Number of children: Not on file   Years of education: Not on file   Highest education level: Not on file  Occupational History   Not on file  Tobacco Use   Smoking status: Every Day    Pack years: 0.00    Types: E-cigarettes   Smokeless tobacco: Never   Tobacco comments:    Vapes daily  Vaping Use   Vaping Use: Every day   Substances:  Nicotine, Flavoring  Substance and Sexual Activity   Alcohol use: Not Currently   Drug use: Never   Sexual activity: Yes    Partners: Female  Other Topics Concern   Not on file  Social History Narrative   Not on file   Social Determinants of Health   Financial Resource Strain: Not on file  Food Insecurity: Not on file  Transportation Needs: Not on file  Physical Activity: Not on file  Stress: Not on file  Social Connections: Not on file   Vitals:   05/21/21 1544  BP: 128/90  Pulse: 96  Resp: 16  SpO2: 99%   Wt Readings from Last 3 Encounters:  05/21/21 202 lb (91.6 kg)  01/08/21 209 lb (94.8 kg)  07/03/20 (!) 205 lb (93 kg)   Body mass index is 31.64 kg/m.  Physical Exam Nursing note reviewed.  Constitutional:      General: He is not in acute distress.    Appearance: He is well-developed.  HENT:     Head: Normocephalic and atraumatic.  Eyes:     Conjunctiva/sclera: Conjunctivae normal.  Cardiovascular:     Rate and Rhythm: Normal rate and regular rhythm.     Pulses:          Dorsalis pedis pulses are 2+ on the right side and 2+ on the left side.     Heart sounds: No murmur heard. Pulmonary:     Effort: Pulmonary effort is normal. No respiratory distress.     Breath sounds: Normal breath sounds.  Abdominal:     Palpations: Abdomen is soft. There is no hepatomegaly or mass.     Tenderness: There is no abdominal tenderness.  Lymphadenopathy:     Cervical: No cervical adenopathy.  Skin:    General: Skin is warm.     Findings: No erythema or rash.  Neurological:     Mental Status: He is alert and oriented to person, place, and time.     Cranial Nerves: No cranial nerve deficit.     Gait: Gait normal.  Psychiatric:     Comments: Well groomed, good eye contact.   ASSESSMENT AND PLAN:   [name] was seen today for 6 months follow-up.  Orders Placed This Encounter  Procedures   Basic metabolic panel   CBC   POC HgB A1c   Lab Results   Component Value Date   HGBA1C 6.9 05/21/2021   Lab Results  Component Value Date   CREATININE 0.86 05/21/2021   BUN 12 05/21/2021   NA 140 05/21/2021   K 4.7 05/21/2021  CL 104 05/21/2021   CO2 29 05/21/2021   Lab Results  Component Value Date   WBC 4.8 05/21/2021   HGB 14.3 05/21/2021   HCT 43.1 05/21/2021   MCV 80.5 05/21/2021   PLT 226.0 05/21/2021   Type II diabetes mellitus with neurological manifestations ([place]) HgA1C went from 6.3 to 6.9. For now continue Glipizide XL 5 mg daily and Trulicity 1.5 mg weekly. Caution with skipping meals and risk of hypoglycemia. Regular exercise and healthy diet with avoidance of added sugar food intake is an important part of treatment and recommended. Annual eye exam, periodic dental and foot care recommended. F/U in 5-6 months  Hypertension, essential, benign DBP mildly elevated. Recommend monitoring BP regularly. Continue non pharmacologic treatment.  Irritable bowel syndrome with both constipation and diarrhea Instructed to take Bentyl 10 mg before breakfast and lunch. Try to avoid foods that can aggravate problem.  Dizziness Possible etiologies discussed. Adequate hydration when working outdoors. CBC and BMP ordered today. Instructed about warning signs.  Class 1 obesity due to excess calories with serious comorbidity and body mass index (BMI) of 31.0 to 31.9 in adult Has lost about 6-7 Lb since his last visit. We discussed benefits of wt loss as well as adverse effects of obesity. Consistency with healthy diet and physical activity recommended. Avoid snacking on chocolate bars.  Return in about 5 months (around 10/21/2021) for DM II,HTN.   Kourtni Stineman G. Martinique, MD  [facility]. [place] office.   A few things to remember from today's visit:   Type II diabetes mellitus with neurological manifestations ([place]) - Plan: POC HgB A1c  Hypertension, essential, benign - Plan: Basic metabolic panel,  CBC  Irritable bowel syndrome with both constipation and diarrhea  Dizziness - Plan: Basic metabolic panel, CBC  If you need refills please call your pharmacy. Do not use My Chart to request refills or for acute issues that need immediate attention. Take bentyl before breakfast and lunch. Adequate hydration and move slower when working outdoors. No changes in medications. Monitor blood pressure and stop snacking on chocolates.  Please be sure medication list is accurate. If a new problem present, please set up appointment sooner than planned today.

## 2021-05-22 LAB — CBC
HCT: 43.1 % (ref 39.0–52.0)
Hemoglobin: 14.3 g/dL (ref 13.0–17.0)
MCHC: 33.2 g/dL (ref 30.0–36.0)
MCV: 80.5 fl (ref 78.0–100.0)
Platelets: 226 10*3/uL (ref 150.0–400.0)
RBC: 5.36 Mil/uL (ref 4.22–5.81)
RDW: 14.3 % (ref 11.5–15.5)
WBC: 4.8 10*3/uL (ref 4.0–10.5)

## 2021-05-22 LAB — BASIC METABOLIC PANEL
BUN: 12 mg/dL (ref 6–23)
CO2: 29 mEq/L (ref 19–32)
Calcium: 9.4 mg/dL (ref 8.4–10.5)
Chloride: 104 mEq/L (ref 96–112)
Creatinine, Ser: 0.86 mg/dL (ref 0.40–1.50)
GFR: 104.34 mL/min (ref >60.00)
Glucose, Bld: 95 mg/dL (ref 70–99)
Potassium: 4.7 mEq/L (ref 3.5–5.1)
Sodium: 140 mEq/L (ref 135–145)

## 2021-05-24 ENCOUNTER — Encounter: Payer: Self-pay | Admitting: Family Medicine

## 2021-05-24 MED ORDER — TRULICITY 1.5 MG/0.5ML ~~LOC~~ SOAJ: SUBCUTANEOUS | 2 refills | Status: DC

## 2021-05-24 MED ORDER — GLIPIZIDE ER 5 MG PO TB24: 5.0000 mg | ORAL_TABLET | Freq: Every day | ORAL | 2 refills | Status: DC

## 2021-06-26 DIAGNOSIS — J069 Acute upper respiratory infection, unspecified: Secondary | ICD-10-CM | POA: Diagnosis not present

## 2021-06-26 DIAGNOSIS — Z20822 Contact with and (suspected) exposure to covid-19: Secondary | ICD-10-CM | POA: Diagnosis not present

## 2021-06-27 ENCOUNTER — Encounter: Payer: Self-pay | Admitting: Family Medicine

## 2021-06-27 ENCOUNTER — Telehealth (INDEPENDENT_AMBULATORY_CARE_PROVIDER_SITE_OTHER): Payer: BC Managed Care – PPO | Admitting: Family Medicine

## 2021-06-27 VITALS — Ht 67.0 in

## 2021-06-27 DIAGNOSIS — R059 Cough, unspecified: Secondary | ICD-10-CM

## 2021-06-27 DIAGNOSIS — J069 Acute upper respiratory infection, unspecified: Secondary | ICD-10-CM

## 2021-06-27 MED ORDER — FLUTICASONE PROPIONATE 50 MCG/ACT NA SUSP: 1.0000 | Freq: Two times a day (BID) | NASAL | 0 refills | Status: DC

## 2021-06-27 MED ORDER — BENZONATATE 100 MG PO CAPS: 200.0000 mg | ORAL_CAPSULE | Freq: Two times a day (BID) | ORAL | 0 refills | Status: AC | PRN

## 2021-06-27 NOTE — Progress Notes (Signed)
Virtual Visit via Video Note I connected with Mitchell Oneill on 06/27/21 by a video enabled telemedicine application and verified that I am speaking with the correct person using two identifiers.  Location patient: home Location provider:work office Persons participating in the virtual visit: patient, provider  I discussed the limitations of evaluation and management by telemedicine and the availability of in person appointments. The patient expressed understanding and agreed to proceed.  Chief Complaint  Patient presents with   possible sinus infection   HPI: Mitchell Oneill is a 45 yo male with hx of DM II,HTN,HLD,IBS,and seasonal allergies c/o 3 days of respiratory symptoms. +Fatigue, frontal pressure headache,nasal congestion,rhinorrhea,post nasal drainage, sore throat,and productive cough with clear sputum. Frontal pressure headache is mild. + Body aches and occasional lightheadedness.  Negative for fever,visual symptoms,anosmia, ageusia,CP,wheezing,SOB,abdominal pain,N/V,changes in bowel habits,urinary symptoms,or skin rash. His wife and mother in law have also been sick, both had COVID 19 test positive. He had home test COVID 19 negative 2 days ago and he was evaluated in acute care yesterday, COVID 19 test was also negative.  He has taken Tylenol Cold and Sinus.  ROS: See pertinent positives and negatives per HPI.  Past Medical History:  Diagnosis Date   Allergy    seasonal   Chicken pox    Diabetes mellitus without complication ([place])    Hypertension    History reviewed. No pertinent surgical history.  Family History  Problem Relation Age of Onset   Arthritis Mother    Hyperlipidemia Mother    Arthritis Father    Hyperlipidemia Father    Diabetes Father    Diabetes Sister    Diabetes Maternal Grandfather    Diabetes Paternal Grandfather    Cancer Neg Hx    Colon cancer Neg Hx    Colon polyps Neg Hx    Esophageal cancer Neg Hx    Rectal cancer Neg Hx    Stomach cancer  Neg Hx     Social History   Socioeconomic History   Marital status: Single    Spouse name: Not on file   Number of children: Not on file   Years of education: Not on file   Highest education level: Not on file  Occupational History   Not on file  Tobacco Use   Smoking status: Every Day    Types: E-cigarettes   Smokeless tobacco: Never   Tobacco comments:    Vapes daily  Vaping Use   Vaping Use: Every day   Substances: Nicotine, Flavoring  Substance and Sexual Activity   Alcohol use: Not Currently   Drug use: Never   Sexual activity: Yes    Partners: Female  Other Topics Concern   Not on file  Social History Narrative   Not on file   Social Determinants of Health   Financial Resource Strain: Not on file  Food Insecurity: Not on file  Transportation Needs: Not on file  Physical Activity: Not on file  Stress: Not on file  Social Connections: Not on file  Intimate Partner Violence: Not on file   Current Outpatient Medications:    bismuth subsalicylate (PEPTO BISMOL) 262 MG/15ML suspension, Take 30 mLs by mouth every 6 (six) hours as needed., Disp: , Rfl:    Blood Glucose Monitoring Suppl (ONE TOUCH ULTRA 2) w/Device KIT, 1 Device by Does not apply route 2 (two) times daily., Disp: 1 each, Rfl: 0   dicyclomine (BENTYL) 10 MG capsule, TAKE 1 CAPSULE (10 MG TOTAL) BY MOUTH 3 (THREE) TIMES  DAILY BEFORE MEALS., Disp: 90 capsule, Rfl: 2   Dulaglutide (TRULICITY) 1.5 QM/2.5OI SOPN, INJECT 1.5 MG UNDER THE SKIN ONCE A WEEK, Disp: 9 mL, Rfl: 2   fluticasone (FLONASE) 50 MCG/ACT nasal spray, Place 1 spray into both nostrils 2 (two) times daily., Disp: 16 g, Rfl: 0   glipiZIDE (GLUCOTROL XL) 5 MG 24 hr tablet, Take 1 tablet (5 mg total) by mouth daily., Disp: 90 tablet, Rfl: 2   glucose blood test strip, Use as instructed, Disp: 180 each, Rfl: 3   Lancets (ONETOUCH ULTRASOFT) lancets, Use as instructed, Disp: 180 each, Rfl: 3   omeprazole (PRILOSEC) 40 MG capsule, Take 1 capsule  (40 mg total) by mouth daily., Disp: 90 capsule, Rfl: 1   pravastatin (PRAVACHOL) 10 MG tablet, TAKE 1 TABLET DAILY, Disp: 90 tablet, Rfl: 3   sildenafil (VIAGRA) 100 MG tablet, Take 100 mg by mouth as needed., Disp: , Rfl:    triamcinolone ointment (KENALOG) 0.1 %, Apply 1 application topically 2 (two) times daily., Disp: 80 g, Rfl: 2  EXAM:  VITALS per patient if applicable:Ht '5\' 7"'  (1.702 m)   BMI 31.64 kg/m   GENERAL: alert, oriented, appears well and in no acute distress  HEENT: atraumatic, conjunctiva clear, no obvious abnormalities on inspection of external nose and ears Nasal congestion noted.  NECK: normal movements of the head and neck  LUNGS: on inspection no signs of respiratory distress, breathing rate appears normal, no obvious gross SOB, gasping or wheezing  CV: no obvious cyanosis  MS: moves all visible extremities without noticeable abnormality  PSYCH/NEURO: pleasant and cooperative, no obvious depression or anxiety, speech and thought processing grossly intact  ASSESSMENT AND PLAN:  Discussed the following assessment and plan:  URI, acute - Plan: fluticasone (FLONASE) 50 MCG/ACT nasal spray Symptoms suggests a viral etiology, COVID 19 test negative x 2 with positive exposure. Symptomatic treatment recommended in this case, so I do not think abx is needed at this time. Contact precautions. Instructed to monitor for signs of complications, including new onset of fever among some, clearly instructed about warning signs. Plenty of po fluids, Tylenol 500 mg 3-4 times prn,and rest. Flonase nasal spray daily prn and saline nasal irrigations.  Note for work to go back Monday 07/02/21.  Cough - Plan: benzonatate (TESSALON) 100 MG capsule Explained that cough and nasal congestion can last a few days and sometimes weeks. Plain Mucinex may help. Benzonatate for symptomatic treatment.   We discussed possible serious and likely etiologies, options for evaluation and  workup, limitations of telemedicine visit vs in person visit, treatment, treatment risks and precautions.  I discussed the assessment and treatment plan with the patient. [name] was provided an opportunity to ask questions and all were answered. He agreed with the plan and demonstrated an understanding of the instructions.  Return if symptoms worsen or fail to improve.  Kenyanna Grzesiak Martinique, MD

## 2021-07-18 ENCOUNTER — Encounter: Payer: Self-pay | Admitting: Family Medicine

## 2021-07-19 ENCOUNTER — Other Ambulatory Visit: Payer: Self-pay | Admitting: Family Medicine

## 2021-07-19 DIAGNOSIS — J069 Acute upper respiratory infection, unspecified: Secondary | ICD-10-CM

## 2021-07-20 ENCOUNTER — Other Ambulatory Visit: Payer: Self-pay | Admitting: Family Medicine

## 2021-07-20 MED ORDER — DICYCLOMINE HCL 10 MG PO CAPS: 10.0000 mg | ORAL_CAPSULE | Freq: Three times a day (TID) | ORAL | 3 refills | Status: DC

## 2021-08-12 ENCOUNTER — Other Ambulatory Visit: Payer: Self-pay | Admitting: Family Medicine

## 2021-08-12 DIAGNOSIS — J069 Acute upper respiratory infection, unspecified: Secondary | ICD-10-CM

## 2021-08-28 ENCOUNTER — Other Ambulatory Visit: Payer: Self-pay

## 2021-08-28 DIAGNOSIS — J069 Acute upper respiratory infection, unspecified: Secondary | ICD-10-CM

## 2021-08-28 MED ORDER — DICYCLOMINE HCL 10 MG PO CAPS: 10.0000 mg | ORAL_CAPSULE | Freq: Three times a day (TID) | ORAL | 3 refills | Status: DC

## 2021-08-28 MED ORDER — FLUTICASONE PROPIONATE 50 MCG/ACT NA SUSP: 1.0000 | Freq: Two times a day (BID) | NASAL | 3 refills | Status: DC

## 2021-10-22 ENCOUNTER — Ambulatory Visit (INDEPENDENT_AMBULATORY_CARE_PROVIDER_SITE_OTHER): Payer: BC Managed Care – PPO

## 2021-10-22 ENCOUNTER — Ambulatory Visit (INDEPENDENT_AMBULATORY_CARE_PROVIDER_SITE_OTHER): Payer: BC Managed Care – PPO | Admitting: Family Medicine

## 2021-10-22 ENCOUNTER — Encounter: Payer: Self-pay | Admitting: Family Medicine

## 2021-10-22 ENCOUNTER — Other Ambulatory Visit: Payer: Self-pay

## 2021-10-22 VITALS — BP 128/84 | HR 94 | Temp 98.4°F | Resp 16 | Ht 67.0 in | Wt 204.0 lb

## 2021-10-22 DIAGNOSIS — Z1159 Encounter for screening for other viral diseases: Secondary | ICD-10-CM | POA: Diagnosis not present

## 2021-10-22 DIAGNOSIS — Z23 Encounter for immunization: Secondary | ICD-10-CM

## 2021-10-22 DIAGNOSIS — K529 Noninfective gastroenteritis and colitis, unspecified: Secondary | ICD-10-CM | POA: Diagnosis not present

## 2021-10-22 DIAGNOSIS — M545 Low back pain, unspecified: Secondary | ICD-10-CM

## 2021-10-22 DIAGNOSIS — I10 Essential (primary) hypertension: Secondary | ICD-10-CM | POA: Diagnosis not present

## 2021-10-22 DIAGNOSIS — M47816 Spondylosis without myelopathy or radiculopathy, lumbar region: Secondary | ICD-10-CM | POA: Diagnosis not present

## 2021-10-22 DIAGNOSIS — E1149 Type 2 diabetes mellitus with other diabetic neurological complication: Secondary | ICD-10-CM

## 2021-10-22 LAB — URINALYSIS, ROUTINE W REFLEX MICROSCOPIC
Bilirubin Urine: NEGATIVE
Hgb urine dipstick: NEGATIVE
Leukocytes,Ua: NEGATIVE
Nitrite: NEGATIVE
RBC / HPF: NONE SEEN (ref 0–?)
Specific Gravity, Urine: 1.025 (ref 1.000–1.030)
Total Protein, Urine: NEGATIVE
Urine Glucose: NEGATIVE
Urobilinogen, UA: 0.2 (ref 0.0–1.0)
pH: 6 (ref 5.0–8.0)

## 2021-10-22 LAB — COMPREHENSIVE METABOLIC PANEL
ALT: 18 U/L (ref 0–53)
AST: 14 U/L (ref 0–37)
Albumin: 4.5 g/dL (ref 3.5–5.2)
Alkaline Phosphatase: 56 U/L (ref 39–117)
BUN: 12 mg/dL (ref 6–23)
CO2: 28 mEq/L (ref 19–32)
Calcium: 9.6 mg/dL (ref 8.4–10.5)
Chloride: 105 mEq/L (ref 96–112)
Creatinine, Ser: 0.8 mg/dL (ref 0.40–1.50)
GFR: 106.33 mL/min (ref >60.00)
Glucose, Bld: 60 mg/dL — ABNORMAL LOW (ref 70–99)
Potassium: 3.8 mEq/L (ref 3.5–5.1)
Sodium: 142 mEq/L (ref 135–145)
Total Bilirubin: 0.9 mg/dL (ref 0.2–1.2)
Total Protein: 7.4 g/dL (ref 6.0–8.3)

## 2021-10-22 LAB — POCT GLYCOSYLATED HEMOGLOBIN (HGB A1C): HbA1c, POC (prediabetic range): 6.1 % (ref 5.7–6.4)

## 2021-10-22 MED ORDER — CYCLOBENZAPRINE HCL 10 MG PO TABS
10.0000 mg | ORAL_TABLET | Freq: Every evening | ORAL | 0 refills | Status: DC | PRN
Start: 2021-10-22 — End: 2021-12-28

## 2021-10-22 MED ORDER — CELECOXIB 100 MG PO CAPS: 100.0000 mg | ORAL_CAPSULE | Freq: Two times a day (BID) | ORAL | 0 refills | Status: AC

## 2021-10-22 NOTE — Progress Notes (Signed)
HPI: [name] is a 45 y.o. male, who is here today for chronic disease management. Last seen on 05/21/21.  Hypertension:  He is on nonpharmacologic treatment. He has not checking BP regularly.  Negative for unusual or severe headache, visual changes, exertional chest pain, dyspnea,  focal weakness, or edema.  Lab Results  Component Value Date   CREATININE 0.86 05/21/2021   BUN 12 05/21/2021   NA 140 05/21/2021   K 4.7 05/21/2021   CL 104 05/21/2021   CO2 29 05/21/2021   Diabetes Mellitus II: Dx'ed in 2000. - Checking BG at home: He is not checking. - Medications: Trulicity 1.5 mg weekly and Glipizide XL 5 mg daily - Compliance: Good - Diet: Not consistently.Skips meals during the day and when he gets home he eats "everything" he can find in the refrigerator. - Exercise: He has not been consistent, he is active at work. - eye exam: 05/14/21 - foot exam: 01/08/21  - Negative for symptoms of hypoglycemia, polyuria, polydipsia,foot ulcers/trauma  Lab Results  Component Value Date   HGBA1C 6.9 05/21/2021   Lab Results  Component Value Date   MICROALBUR <0.7 01/08/2021   Currently on Pravastatin 10 mg daily.  Lab Results  Component Value Date   CHOL 96 12/20/2019   HDL 41.60 12/20/2019   LDLCALC 39 12/20/2019   TRIG 76.0 12/20/2019   CHOLHDL 2 12/20/2019   Diarrhea: Bentyl 10 mg has not helped. Problem is intermittent, alternating with constipation, started again 2 days. Imodium helps. About 2-3 stools at night, exacerbated by food intake, so he does not eat until night time when he is at home. Negative for hematochezia and dyschezia.  He has followed with GI in the past, last visit in 2020. Colonoscopy 02/16/2019.  3-4 weeks of right sided back pain, new problem. No hx of trauma but 2 months ago while at work, he twisted and felt pain. He did not report incident. He is concerned about possible serious process. Negative for fever, chills, abnormal weight  loss, urinary symptoms, or skin rash.  Exacerbated by certain movements and alleviated by rest. Dull, 3/10, constant. Topical OTC medication helps.  Review of Systems  Constitutional:  Negative for activity change, appetite change, fatigue and fever.  HENT:  Negative for mouth sores, nosebleeds and sore throat.   Respiratory:  Negative for cough and wheezing.   Gastrointestinal:  Negative for abdominal pain, nausea and vomiting.  Endocrine: Negative for cold intolerance and heat intolerance.  Genitourinary:  Negative for decreased urine volume and hematuria.  Neurological:  Negative for syncope, facial asymmetry and headaches.  Rest of ROS see pertinent positives and negatives in HPI.  Current Outpatient Medications on File Prior to Visit  Medication Sig Dispense Refill   bismuth subsalicylate (PEPTO BISMOL) 262 MG/15ML suspension Take 30 mLs by mouth every 6 (six) hours as needed.     Blood Glucose Monitoring Suppl (ONE TOUCH ULTRA 2) w/Device KIT 1 Device by Does not apply route 2 (two) times daily. 1 each 0   Dulaglutide (TRULICITY) 1.5 YC/1.4GY SOPN INJECT 1.5 MG UNDER THE SKIN ONCE A WEEK 9 mL 2   glipiZIDE (GLUCOTROL XL) 5 MG 24 hr tablet Take 1 tablet (5 mg total) by mouth daily. 90 tablet 2   glucose blood test strip Use as instructed 180 each 3   Lancets (ONETOUCH ULTRASOFT) lancets Use as instructed 180 each 3   omeprazole (PRILOSEC) 40 MG capsule Take 1 capsule (40 mg total) by mouth daily. [place]  capsule 1   pravastatin (PRAVACHOL) 10 MG tablet TAKE 1 TABLET DAILY 90 tablet 3   triamcinolone ointment (KENALOG) 0.1 % Apply 1 application topically 2 (two) times daily. 80 g 2   fluticasone (FLONASE) 50 MCG/ACT nasal spray Place 1 spray into both nostrils 2 (two) times daily. 48 mL 3   No current facility-administered medications on file prior to visit.    Past Medical History:  Diagnosis Date   Allergy    seasonal   Chicken pox    Diabetes mellitus without complication ([place])     Hypertension     No Known Allergies  Social History   Socioeconomic History   Marital status: Single    Spouse name: Not on file   Number of children: Not on file   Years of education: Not on file   Highest education level: Not on file  Occupational History   Not on file  Tobacco Use   Smoking status: Every Day    Types: E-cigarettes   Smokeless tobacco: Never   Tobacco comments:    Vapes daily  Vaping Use   Vaping Use: Every day   Substances: Nicotine, Flavoring  Substance and Sexual Activity   Alcohol use: Not Currently   Drug use: Never   Sexual activity: Yes    Partners: Female  Other Topics Concern   Not on file  Social History Narrative   Not on file   Social Determinants of Health   Financial Resource Strain: Not on file  Food Insecurity: Not on file  Transportation Needs: Not on file  Physical Activity: Not on file  Stress: Not on file  Social Connections: Not on file    Today's Vitals   10/22/21 1351  BP: 128/84  Pulse: 94  Resp: 16  Temp: 98.4 F (36.9 C)  TempSrc: Oral  SpO2: 99%  Weight: 204 lb (92.5 kg)  Height: _0  (1.702 m)   Body mass index is 31.95 kg/m.  Physical Exam Vitals and nursing note reviewed.  Constitutional:      General: He is not in acute distress.    Appearance: He is well-developed.  HENT:     Head: Normocephalic and atraumatic.     Mouth/Throat:     Mouth: Mucous membranes are moist.     Pharynx: Oropharynx is clear.  Eyes:     Conjunctiva/sclera: Conjunctivae normal.  Cardiovascular:     Rate and Rhythm: Normal rate and regular rhythm.     Pulses:          Dorsalis pedis pulses are 2+ on the right side and 2+ on the left side.     Heart sounds: No murmur heard. Pulmonary:     Effort: Pulmonary effort is normal. No respiratory distress.     Breath sounds: Normal breath sounds.  Abdominal:     Palpations: Abdomen is soft. There is no hepatomegaly or mass.     Tenderness: There is no abdominal  tenderness.  Musculoskeletal:     Thoracic back: No tenderness or bony tenderness.     Lumbar back: No tenderness or bony tenderness. Negative right straight leg raise test and negative left straight leg raise test.       Back:  Lymphadenopathy:     Cervical: No cervical adenopathy.  Skin:    General: Skin is warm.     Findings: No erythema or rash.  Neurological:     Mental Status: He is alert and oriented to person, place, and time.  Cranial Nerves: No cranial nerve deficit.     Gait: Gait normal.  Psychiatric:     Comments: Well groomed, good eye contact.   ASSESSMENT AND PLAN:  [name] was seen today for follow-up.  Diagnoses and all orders for this visit: Orders Placed This Encounter  Procedures   DG Lumbar Spine Complete   Flu Vaccine QUAD 23moIM (Fluarix, Fluzone & Alfiuria Quad PF)   Hepatitis C antibody   Urinalysis, Routine w reflex microscopic   Comprehensive metabolic panel   Ambulatory referral to Gastroenterology   POC HgB A1c   Lab Results  Component Value Date   HGBA1C 6.1 10/22/2021   Lab Results  Component Value Date   ALT 18 10/22/2021   AST 14 10/22/2021   ALKPHOS 56 10/22/2021   BILITOT 0.9 10/22/2021   Lab Results  Component Value Date   CREATININE 0.80 10/22/2021   BUN 12 10/22/2021   NA 142 10/22/2021   K 3.8 10/22/2021   CL 105 10/22/2021   CO2 28 10/22/2021   Chronic diarrhea of unknown origin We reviewed possible causes. Continue OTC Imodium once daily. Colonoscopy in 02/2019 negative. ?  IBS, we discussed diagnosis and prognosis. GI referral placed.   Acute right-sided low back pain without sciatica We discussed possible etiologies. History and examination do not suggest a serious process. He seems to be muscular pain. UA and lumbar x-ray ordered today. Monitor for new symptoms. 7 to 10 days of Celebrex + 2-3 weeks of muscle relaxant (Flexeril) recommended, we discussed side effects of both medications, they are not  intended for chronic treatment.  -     celecoxib (CELEBREX) 100 MG capsule; Take 1 capsule (100 mg total) by mouth 2 (two) times daily for 10 days. -     cyclobenzaprine (FLEXERIL) 10 MG tablet; Take 1 tablet (10 mg total) by mouth at bedtime as needed for muscle spasms.  Need for influenza vaccination -     Flu Vaccine QUAD 667moM (Fluarix, Fluzone & Alfiuria Quad PF)  Type II diabetes mellitus with neurological manifestations ([place] at goal. No changes in current management. Regular exercise and healthy diet with avoidance of added sugar food intake is an important part of treatment and recommended. Annual eye exam, periodic dental and foot care recommended. F/U in 5-6 months   Hypertension, essential, benign BP adequately controlled. Continue non pharmacologic treatment. Eye exam is current.  Encounter for HCV screening test for low risk patient -     Hepatitis C antibody  Return in about 5 months (around 03/22/2022) for CPE and f/u.  Mitchell Brault JoMartiniqueMD [facility].

## 2021-10-22 NOTE — Assessment & Plan Note (Signed)
HgA1C at goal. No changes in current management. Regular exercise and healthy diet with avoidance of added sugar food intake is an important part of treatment and recommended. Annual eye exam, periodic dental and foot care recommended. F/U in 5-6 months  

## 2021-10-22 NOTE — Patient Instructions (Addendum)
A few things to remember from today's visit:  Type II diabetes mellitus with neurological manifestations (HCC) - Plan: POC HgB A1c, Comprehensive metabolic panel  Hypertension, essential, benign - Plan: Comprehensive metabolic panel  Encounter for HCV screening test for low risk patient - Plan: Hepatitis C antibody  Chronic diarrhea of unknown origin - Plan: Ambulatory referral to Gastroenterology  Acute right-sided low back pain without sciatica - Plan: Urinalysis, Routine w reflex microscopic, celecoxib (CELEBREX) 100 MG capsule, cyclobenzaprine (FLEXERIL) 10 MG tablet, DG Lumbar Spine Complete  If you need refills please call your pharmacy. Do not use My Chart to request refills or for acute issues that need immediate attention.   Please be sure medication list is accurate. If a new problem present, please set up appointment sooner than planned today. Dolor de espalda parece muscular. Icy hot en la area afectada. Celebrex por 7-10 dias y Flexeril a la hora de la cama.

## 2021-10-22 NOTE — Assessment & Plan Note (Signed)
BP adequately controlled. Continue nonpharmacologic treatment. Eye exam is current. 

## 2021-10-23 LAB — HEPATITIS C ANTIBODY
Hepatitis C Ab: NONREACTIVE
SIGNAL TO CUT-OFF: 0.02 (ref <1.00)

## 2021-11-16 ENCOUNTER — Other Ambulatory Visit: Payer: Self-pay | Admitting: Family Medicine

## 2021-12-27 ENCOUNTER — Other Ambulatory Visit: Payer: Self-pay | Admitting: Family Medicine

## 2021-12-27 DIAGNOSIS — M545 Low back pain, unspecified: Secondary | ICD-10-CM

## 2022-01-28 DIAGNOSIS — M4306 Spondylolysis, lumbar region: Secondary | ICD-10-CM | POA: Diagnosis not present

## 2022-02-04 DIAGNOSIS — M47896 Other spondylosis, lumbar region: Secondary | ICD-10-CM | POA: Diagnosis not present

## 2022-02-04 DIAGNOSIS — M4306 Spondylolysis, lumbar region: Secondary | ICD-10-CM | POA: Diagnosis not present

## 2022-02-04 DIAGNOSIS — S39012D Strain of muscle, fascia and tendon of lower back, subsequent encounter: Secondary | ICD-10-CM | POA: Diagnosis not present

## 2022-02-08 NOTE — Progress Notes (Signed)
? ? ?02/11/2022 ?Justun Anaya ?[id number] ?1976-11-17 ? ? ?ASSESSMENT AND PLAN:  ? ?Irritable bowel syndrome with both constipation and diarrhea ?-     Pancreatic elastase, fecal; Future ?-     DG Abd 1 View; Future ?Had normal EGD/Colon ?Negative celiac, Negative fecal calprotectin, giardia, sed rate/CRP, TSH.  ?Check Xray for constipation ?Given FODMAP information and discussed diet.  ?Check pancreatic elastase ?If negative, can consider trial of xifaxin ? ?Type II diabetes mellitus with neurological manifestations ([place]) ?DM x 20 years, better controlled now but has history of poorly controlled DM, will evaluate for EPI.  ? ?Gastroesophageal reflux disease, unspecified whether esophagitis present ?he reports symptoms are currently well controlled, and denies breakthrough reflux, burning in chest, hoarseness or cough. ?Lifestyle changes discussed, avoid NSAIDS ?Continue current medications ? ?Diverticulosis of colon without hemorrhage ?Will call if any symptoms. ?Add on fiber supplement, avoid NSAIDS, information given ? ? ?History of Present Illness:  ?46 y.o. male  with a past medical history of DM2 x 20 years, HTN, diarrhea and others listed below, known to Dr. Tarri Glenn returns to clinic today for evaluation of chronic diarrhea. ?Last seen by Dr. Tarri Glenn for Diarrhea in 2020 ?02/16/2019 Colon/EGD colon with tics, negative for microscopic colitis EGD normal negative for celiac.  ?Negative fecal calprotectin, giardia, sed rate/CRP, TSH.  ? ?He is either having constipation or diarrhea.  ?He is on imodium every day almost due to fear of diarrhea. ?He works for [employer], so during the day he will not eat during the day due to not being able to have a BM. ?Does not make a different what he eats, can be healthy or not.  ?Can have 2-3 days without BM, or can have 2-3 episodes of diarrhea a day, has had rare nocturnal symptoms with fecal incontinence of water.  ?When he eats will have increase in AB noises, with bloating,  feels uncomfortable.  ?The other day had very hard painful bm's with straining.  ?No blood in the stool, no weight loss.  ?No fever, chills.  ? ?Current Medications:  ? ?Current Outpatient Medications (Endocrine & Metabolic):  ?  Dulaglutide (TRULICITY) 1.5 OL/0.7EM SOPN, INJECT 1.5 MG UNDER THE SKIN ONCE A WEEK ?  glipiZIDE (GLUCOTROL XL) 5 MG 24 hr tablet, Take 1 tablet (5 mg total) by mouth daily. ? ?Current Outpatient Medications (Cardiovascular):  ?  pravastatin (PRAVACHOL) 10 MG tablet, TAKE 1 TABLET DAILY ? ?Current Outpatient Medications (Respiratory):  ?  fexofenadine (ALLEGRA) 180 MG tablet, Take 180 mg by mouth daily. ? ?Current Outpatient Medications (Analgesics):  ?  diclofenac (VOLTAREN) 75 MG EC tablet, Take 75 mg by mouth 2 (two) times daily. ? ? ?Current Outpatient Medications (Other):  ?  Blood Glucose Monitoring Suppl (ONE TOUCH ULTRA 2) w/Device KIT, 1 Device by Does not apply route 2 (two) times daily. ?  glucose blood test strip, Use as instructed ?  Lancets (ONETOUCH ULTRASOFT) lancets, Use as instructed ?  omeprazole (PRILOSEC) 40 MG capsule, Take 1 capsule (40 mg total) by mouth daily. ?  tiZANidine (ZANAFLEX) 4 MG tablet, Take 2 mg by mouth 3 (three) times daily as needed. ? ?Surgical History:  ?He  has no past surgical history on file. ?Family History:  ?His family history includes Arthritis in his father and mother; Diabetes in his father, maternal grandfather, paternal grandfather, and sister; Hyperlipidemia in his father and mother. ?Social History:  ? reports that he has been smoking e-cigarettes. He has never used smokeless tobacco. He reports  that he does not currently use alcohol. He reports that he does not use drugs. ? ?Current Medications, Allergies, Past Medical History, Past Surgical History, Family History and Social History were reviewed in [employer] record. ? ?Physical Exam: ?BP 132/78   Pulse 64   Ht '5\' 7"'  (1.702 m)   Wt 199 lb (90.3 kg)   BMI  31.17 kg/m?  ?General:   Pleasant, well developed male in no acute distress ?Eyes: sclerae anicteric,conjunctive pink  ?Heart:  regular rate and rhythm ?Pulm: Clear anteriorly; no wheezing ?Abdomen:  Soft, Obese AB, skin exam normal, Normal bowel sounds. mild tenderness in the lower abdomen. Without guarding and Without rebound, without hepatomegaly. ?Extremities:  Without edema. Peripheral pulses intact.  ?Neurologic:  Alert and  oriented x4;  grossly normal neurologically. ?Skin:   Dry and intact without significant lesions or rashes. ?Psychiatric: Demonstrates good judgement and reason without abnormal affect or behaviors. ? ?Vladimir Crofts, PA-C ?02/11/22 ?

## 2022-02-11 ENCOUNTER — Encounter: Payer: Self-pay | Admitting: Physician Assistant

## 2022-02-11 ENCOUNTER — Other Ambulatory Visit: Payer: Self-pay

## 2022-02-11 ENCOUNTER — Other Ambulatory Visit: Payer: BC Managed Care – PPO

## 2022-02-11 ENCOUNTER — Ambulatory Visit (INDEPENDENT_AMBULATORY_CARE_PROVIDER_SITE_OTHER): Payer: BC Managed Care – PPO | Admitting: Physician Assistant

## 2022-02-11 ENCOUNTER — Ambulatory Visit (INDEPENDENT_AMBULATORY_CARE_PROVIDER_SITE_OTHER)
Admission: RE | Admit: 2022-02-11 | Discharge: 2022-02-11 | Disposition: A | Discharge status: Home / Self Care | Payer: BC Managed Care – PPO | Source: Ambulatory Visit | Attending: Physician Assistant | Admitting: Physician Assistant

## 2022-02-11 VITALS — BP 132/78 | HR 64 | Ht 67.0 in | Wt 199.0 lb

## 2022-02-11 DIAGNOSIS — K219 Gastro-esophageal reflux disease without esophagitis: Secondary | ICD-10-CM | POA: Diagnosis not present

## 2022-02-11 DIAGNOSIS — K582 Mixed irritable bowel syndrome: Secondary | ICD-10-CM | POA: Diagnosis not present

## 2022-02-11 DIAGNOSIS — E1149 Type 2 diabetes mellitus with other diabetic neurological complication: Secondary | ICD-10-CM

## 2022-02-11 DIAGNOSIS — K5730 Diverticulosis of large intestine without perforation or abscess without bleeding: Secondary | ICD-10-CM | POA: Diagnosis not present

## 2022-02-11 DIAGNOSIS — K5900 Constipation, unspecified: Secondary | ICD-10-CM | POA: Diagnosis not present

## 2022-02-11 NOTE — Progress Notes (Signed)
Reviewed.  Laurisa Sahakian L. Wanza Szumski, MD, MPH  

## 2022-02-11 NOTE — Patient Instructions (Addendum)
? ?Will get xray to look for constipation ?Will get pancreatic elastase to look for exocrine pancreatic insufficiency ? ?If this is negative can possible do trial of Xifaxin for Small intestinal bacterial overgrowth.  ? ?Diverticulosis ?Diverticulosis is a condition that develops when small pouches (diverticula) form in the wall of the large intestine (colon). The colon is where water is absorbed and stool (feces) is formed. The pouches form when the inside layer of the colon pushes through weak spots in the outer layers of the colon. You may have a few pouches or many of them. ?The pouches usually do not cause problems unless they become inflamed or infected. When this happens, the condition is called diverticulitis- this is left lower quadrant pain, diarrhea, fever, chills, nausea or vomiting.  If this occurs please call the office or go to the hospital. ? ?Sometimes these patches without inflammation can also have painless bleeding associated with them, if this happens please call the office or go to the hospital. ? ?Preventing constipation and increasing fiber can help reduce diverticula and prevent complications. ?Even if you feel you have a high-fiber diet, suggest getting on Benefiber 2-3 times daily. ? ?FIBER SUPPLEMENT ? ?Benefiber or Citracel is good for constipation/diarrhea/irritable bowel syndrome, it helps with weight loss and can help lower your bad cholesterol. Please do 1 TBSP in the morning in water, coffee, or tea. It can take up to a month before you can see a difference with your bowel movements. It is cheapest from [employer], [employer], [employer].  ? ?What increases the risk? ?The following factors may make you more likely to develop this condition: ?Being older than age 48. Your risk for this condition increases with age. Diverticulosis is rare among people younger than age 47. By age 70, many people have it. ?Eating a low-fiber diet. ?Having frequent constipation. ?Being overweight. ?Not getting  enough exercise. ?Smoking. ?Taking over-the-counter pain medicines, like aspirin and ibuprofen.- can increase a flare.  ?Having a family history of diverticulosis. ?How is this diagnosed? ?Because diverticulosis usually has no symptoms, it is most often diagnosed during an exam for other colon problems. The condition may be diagnosed by: ?Using a flexible scope to examine the colon (colonoscopy). ?Taking an X-ray of the colon after dye has been put into the colon (barium enema). ?Having a CT scan. ?How is this treated? ?You may not need treatment for this condition. Your health care provider may recommend treatment to prevent problems. You may need treatment if you have symptoms or if you previously had diverticulitis. Treatment may include: ?Eating a high-fiber diet. ?Taking a fiber supplement. ?Taking a live bacteria supplement (probiotic). ?Taking medicine to relax your colon. ?Contact a health care provider if you: ?Have pain in your abdomen. ?Have bloating. ?Have cramps. ?Have not had a bowel movement in 3 days. ?Get help right away if: ?Your pain gets worse. ?Your bloating becomes very bad. ?You have a fever or chills, and your symptoms suddenly get worse. ?You vomit. ?You have bowel movements that are bloody or black. ?You have bleeding from your rectum. ?Summary ?Diverticulosis is a condition that develops when small pouches (diverticula) form in the wall of the large intestine (colon). ?You may have a few pouches or many of them. ?This condition is most often diagnosed during an exam for other colon problems. ?Treatment may include increasing the fiber in your diet, taking supplements, or taking medicines. ?This information is not intended to replace advice given to you by your health care provider. Make  sure you discuss any questions you have with your health care provider. ?Document Revised: 06/24/2019 Document Reviewed: 06/24/2019 ?Elsevier Patient Education ? [place]. ? ?If you are age 3 or  older, your body mass index should be between 23-30. Your Body mass index is 31.17 kg/m?Marland Kitchen If this is out of the aforementioned range listed, please consider follow up with your Primary Care Provider. ? ?If you are age 51 or younger, your body mass index should be between 19-25. Your Body mass index is 31.17 kg/m?Marland Kitchen If this is out of the aformentioned range listed, please consider follow up with your Primary Care Provider.  ? ?________________________________________________________ ? ?The [facility] providers would like to encourage you to use [facility] to communicate with providers for non-urgent requests or questions.  Due to long hold times on the telephone, sending your provider a message by [facility] may be a faster and more efficient way to get a response.  Please allow 48 business hours for a response.  Please remember that this is for non-urgent requests.  ?_______________________________________________________  ?Your provider has requested that you go to the basement level for lab work before leaving today. Press "B" on the elevator. The lab is located at the first door on the left as you exit the elevator.  ? ?Your provider has requested that you have an abdominal x ray before leaving today. Please go to the basement floor to our Radiology department for the test.  ? ?Due to recent changes in healthcare laws, you may see the results of your imaging and laboratory studies on MyChart before your provider has had a chance to review them.  We understand that in some cases there may be results that are confusing or concerning to you. Not all laboratory results come back in the same time frame and the provider may be waiting for multiple results in order to interpret others.  Please give [place] 48 hours in order for your provider to thoroughly review all the results before contacting the office for clarification of your results.   ? ?

## 2022-02-18 ENCOUNTER — Other Ambulatory Visit: Payer: BC Managed Care – PPO

## 2022-02-18 DIAGNOSIS — S39012D Strain of muscle, fascia and tendon of lower back, subsequent encounter: Secondary | ICD-10-CM | POA: Diagnosis not present

## 2022-02-18 DIAGNOSIS — M47896 Other spondylosis, lumbar region: Secondary | ICD-10-CM | POA: Diagnosis not present

## 2022-02-18 DIAGNOSIS — K582 Mixed irritable bowel syndrome: Secondary | ICD-10-CM | POA: Diagnosis not present

## 2022-02-18 DIAGNOSIS — M4306 Spondylolysis, lumbar region: Secondary | ICD-10-CM | POA: Diagnosis not present

## 2022-02-25 DIAGNOSIS — S39012D Strain of muscle, fascia and tendon of lower back, subsequent encounter: Secondary | ICD-10-CM | POA: Diagnosis not present

## 2022-02-25 DIAGNOSIS — M47896 Other spondylosis, lumbar region: Secondary | ICD-10-CM | POA: Diagnosis not present

## 2022-02-25 DIAGNOSIS — M4306 Spondylolysis, lumbar region: Secondary | ICD-10-CM | POA: Diagnosis not present

## 2022-02-26 LAB — PANCREATIC ELASTASE, FECAL: Pancreatic Elastase-1, Stool: >500 mcg/g

## 2022-03-06 ENCOUNTER — Other Ambulatory Visit: Payer: Self-pay

## 2022-03-06 DIAGNOSIS — E1149 Type 2 diabetes mellitus with other diabetic neurological complication: Secondary | ICD-10-CM

## 2022-03-06 MED ORDER — TRULICITY 1.5 MG/0.5ML ~~LOC~~ SOAJ: SUBCUTANEOUS | 1 refills | Status: DC

## 2022-03-11 DIAGNOSIS — M4306 Spondylolysis, lumbar region: Secondary | ICD-10-CM | POA: Diagnosis not present

## 2022-03-11 DIAGNOSIS — S39012D Strain of muscle, fascia and tendon of lower back, subsequent encounter: Secondary | ICD-10-CM | POA: Diagnosis not present

## 2022-03-11 DIAGNOSIS — M47896 Other spondylosis, lumbar region: Secondary | ICD-10-CM | POA: Diagnosis not present

## 2022-03-22 NOTE — Progress Notes (Signed)
? ?HPI: ?Mitchell Oneill is a 46 y.o.male here today for his routine physical examination. ? ?Last CPE: 2021 ? ?Regular exercise: He does not have an exercise routine, he is active at work. ?Following a healthful diet: He does not eat fast food. Eats once daily, his wife cooks. ? ?Chronic medical problems: DM II,HTN,back pain,peripheral neuropathy,and IBS among some. ? ?Immunization History  ?Administered Date(s) Administered  ? Influenza,inj,Quad PF,6+ Mos 10/22/2017, 12/14/2018, 12/20/2019, 10/22/2021  ? Influenza-Unspecified 11/19/2020  ? PFIZER(Purple Top)SARS-COV-2 Vaccination 04/09/2020, 04/30/2020, 11/19/2020  ? Pneumococcal Polysaccharide-23 12/14/2018  ? Td 01/10/2016  ? Tdap 12/10/2015  ? ? ?Health Maintenance  ?Topic Date Due  ? COVID-19 Vaccine (4 - Booster for [place] series) 04/07/2022 (Originally 01/14/2021)  ? HIV Screening  12/22/2023 (Originally [birth year]1990)  ? OPHTHALMOLOGY EXAM  05/14/2022  ? INFLUENZA VACCINE  07/09/2022  ? HEMOGLOBIN A1C  09/24/2022  ? FOOT EXAM  03/26/2023  ? URINE MICROALBUMIN  03/26/2023  ? TETANUS/TDAP  01/09/2026  ? COLONOSCOPY (Pts 45-33yr Insurance coverage will need to be confirmed)  02/15/2029  ? Hepatitis C Screening  Completed  ? HPV VACCINES  Aged Out  ? ?-Negative for high alcohol intake or tobacco use. ? ?Since his last visit he has seen GI and orthopedist. ?Currently doing PT for lower back pain. ? ?Lab Results  ?Component Value Date  ? CHOL 96 12/20/2019  ? HDL 41.60 12/20/2019  ? LEmerson39 12/20/2019  ? TRIG 76.0 12/20/2019  ? CHOLHDL 2 12/20/2019  ? ?DM II: He is on Trulicity 1.5 mg weekly and takes Glipizide XL 5 mg as needed, 1-2 times per week. ?He is drinking peach tea x 2 daily. ?Because chronic diarrhea, he eats once daily, dinner. ? ?HgA1C 10/2021 was 6.1. ?Lab Results  ?Component Value Date  ? MICROALBUR <0.7 01/08/2021  ? MICROALBUR 1.0 12/20/2019  ? ?Review of Systems  ?Constitutional:  Negative for activity change, appetite change and fever.  ?HENT:   Negative for nosebleeds and sore throat.   ?Eyes:  Negative for redness and visual disturbance.  ?Respiratory:  Negative for cough, shortness of breath and wheezing.   ?Cardiovascular:  Negative for chest pain, palpitations and leg swelling.  ?Gastrointestinal:  Negative for abdominal pain, nausea and vomiting.  ?Endocrine: Negative for cold intolerance, heat intolerance, polydipsia, polyphagia and polyuria.  ?Genitourinary:  Negative for decreased urine volume, dysuria, genital sores, hematuria and testicular pain.  ?Musculoskeletal:  Positive for back pain. Negative for gait problem.  ?Skin:  Negative for color change and rash.  ?Allergic/Immunologic: Positive for environmental allergies.  ?Neurological:  Negative for syncope, weakness and headaches.  ?Hematological:  Negative for adenopathy. Does not bruise/bleed easily.  ?Psychiatric/Behavioral:  Negative for confusion. The patient is not nervous/anxious.   ?All other systems reviewed and are negative. ? ?Current Outpatient Medications on File Prior to Visit  ?Medication Sig Dispense Refill  ? Blood Glucose Monitoring Suppl (ONE TOUCH ULTRA 2) w/Device KIT 1 Device by Does not apply route 2 (two) times daily. 1 each 0  ? diclofenac (VOLTAREN) 75 MG EC tablet Take 75 mg by mouth 2 (two) times daily.    ? Dulaglutide (TRULICITY) 1.5 MNT/7.0YFSOPN INJECT 1.5 MG UNDER THE SKIN ONCE A WEEK 9 mL 1  ? fexofenadine (ALLEGRA) 180 MG tablet Take 180 mg by mouth daily.    ? glucose blood test strip Use as instructed 180 each 3  ? Lancets (ONETOUCH ULTRASOFT) lancets Use as instructed 180 each 3  ? omeprazole (PRILOSEC) 40 MG  capsule Take 1 capsule (40 mg total) by mouth daily. 90 capsule 1  ? pravastatin (PRAVACHOL) 10 MG tablet TAKE 1 TABLET DAILY 90 tablet 3  ? tiZANidine (ZANAFLEX) 4 MG tablet Take 2 mg by mouth 3 (three) times daily as needed.    ? ?No current facility-administered medications on file prior to visit.  ? ?Past Medical History:  ?Diagnosis Date  ?  Allergy   ? seasonal  ? Chicken pox   ? Diabetes mellitus without complication ([place])   ? Hypertension   ? ?History reviewed. No pertinent surgical history. ? ?No Known Allergies ? ?Family History  ?Problem Relation Age of Onset  ? Arthritis Mother   ? Hyperlipidemia Mother   ? Arthritis Father   ? Hyperlipidemia Father   ? Diabetes Father   ? Diabetes Sister   ? Diabetes Maternal Grandfather   ? Diabetes Paternal Grandfather   ? Cancer Neg Hx   ? Colon cancer Neg Hx   ? Colon polyps Neg Hx   ? Esophageal cancer Neg Hx   ? Rectal cancer Neg Hx   ? Stomach cancer Neg Hx   ? ? ?Social History  ? ?Socioeconomic History  ? Marital status: Single  ?  Spouse name: Not on file  ? Number of children: Not on file  ? Years of education: Not on file  ? Highest education level: Not on file  ?Occupational History  ? Not on file  ?Tobacco Use  ? Smoking status: Every Day  ?  Types: E-cigarettes  ? Smokeless tobacco: Never  ? Tobacco comments:  ?  Vapes daily  ?Vaping Use  ? Vaping Use: Every day  ? Substances: Nicotine, Flavoring  ?Substance and Sexual Activity  ? Alcohol use: Not Currently  ? Drug use: Never  ? Sexual activity: Yes  ?  Partners: Female  ?Other Topics Concern  ? Not on file  ?Social History Narrative  ? Not on file  ? ?Social Determinants of Health  ? ?Financial Resource Strain: Not on file  ?Food Insecurity: Not on file  ?Transportation Needs: Not on file  ?Physical Activity: Not on file  ?Stress: Not on file  ?Social Connections: Not on file  ? ?Vitals:  ? 03/25/22 1349  ?BP: 110/70  ?Pulse: 88  ?Resp: 16  ?SpO2: 99%  ? ?Body mass index is 31.5 kg/m?. ? ?Wt Readings from Last 3 Encounters:  ?03/25/22 201 lb 2 oz (91.2 kg)  ?02/11/22 199 lb (90.3 kg)  ?10/22/21 204 lb (92.5 kg)  ? ?Physical Exam ?Vitals and nursing note reviewed.  ?Constitutional:   ?   General: He is not in acute distress. ?   Appearance: He is well-developed.  ?HENT:  ?   Head: Normocephalic and atraumatic.  ?   Right Ear: Tympanic membrane,  ear canal and external ear normal.  ?   Left Ear: Tympanic membrane, ear canal and external ear normal.  ?   Mouth/Throat:  ?   Mouth: Mucous membranes are moist.  ?   Pharynx: Oropharynx is clear.  ?Eyes:  ?   Extraocular Movements: Extraocular movements intact.  ?   Conjunctiva/sclera: Conjunctivae normal.  ?   Pupils: Pupils are equal, round, and reactive to light.  ?Neck:  ?   Thyroid: No thyromegaly.  ?   Trachea: No tracheal deviation.  ?Cardiovascular:  ?   Rate and Rhythm: Normal rate and regular rhythm.  ?   Pulses:     ?     Dorsalis  pedis pulses are 2+ on the right side and 2+ on the left side.  ?   Heart sounds: No murmur heard. ?Pulmonary:  ?   Effort: Pulmonary effort is normal. No respiratory distress.  ?   Breath sounds: Normal breath sounds.  ?Abdominal:  ?   Palpations: Abdomen is soft. There is no hepatomegaly or mass.  ?   Tenderness: There is no abdominal tenderness.  ?Genitourinary: ?   Comments: No concerns. ?Musculoskeletal:     ?   General: No tenderness.  ?   Cervical back: Normal range of motion.  ?   Comments: No major deformities appreciated and no signs of synovitis.  ?Lymphadenopathy:  ?   Cervical: No cervical adenopathy.  ?   Upper Body:  ?   Right upper body: No supraclavicular adenopathy.  ?   Left upper body: No supraclavicular adenopathy.  ?Skin: ?   General: Skin is warm.  ?   Findings: No erythema.  ?Neurological:  ?   General: No focal deficit present.  ?   Mental Status: He is alert and oriented to person, place, and time.  ?   Cranial Nerves: No cranial nerve deficit.  ?   Sensory: No sensory deficit.  ?   Coordination: Coordination normal.  ?   Gait: Gait normal.  ?   Deep Tendon Reflexes:  ?   Reflex Scores: ?     Bicep reflexes are 2+ on the right side and 2+ on the left side. ?     Patellar reflexes are 2+ on the right side and 2+ on the left side. ?Psychiatric:     ?   Mood and Affect: Mood and affect normal.  ? ?Diabetic Foot Exam - Simple   ?Simple Foot Form ?Diabetic  Foot exam was performed with the following findings: Yes 03/25/2022  2:37 PM  ?Visual Inspection ?No deformities, no ulcerations, no other skin breakdown bilaterally: Yes ?Sensation Testing ?Intact to touch

## 2022-03-25 ENCOUNTER — Ambulatory Visit (INDEPENDENT_AMBULATORY_CARE_PROVIDER_SITE_OTHER): Payer: BC Managed Care – PPO | Admitting: Family Medicine

## 2022-03-25 ENCOUNTER — Encounter: Payer: Self-pay | Admitting: Family Medicine

## 2022-03-25 VITALS — BP 110/70 | HR 88 | Resp 16 | Ht 67.0 in | Wt 201.1 lb

## 2022-03-25 DIAGNOSIS — M545 Low back pain, unspecified: Secondary | ICD-10-CM | POA: Diagnosis not present

## 2022-03-25 DIAGNOSIS — Z13220 Encounter for screening for lipoid disorders: Secondary | ICD-10-CM

## 2022-03-25 DIAGNOSIS — Z0000 Encounter for general adult medical examination without abnormal findings: Secondary | ICD-10-CM | POA: Diagnosis not present

## 2022-03-25 DIAGNOSIS — E1149 Type 2 diabetes mellitus with other diabetic neurological complication: Secondary | ICD-10-CM

## 2022-03-25 DIAGNOSIS — M4306 Spondylolysis, lumbar region: Secondary | ICD-10-CM | POA: Diagnosis not present

## 2022-03-25 DIAGNOSIS — I10 Essential (primary) hypertension: Secondary | ICD-10-CM | POA: Diagnosis not present

## 2022-03-25 LAB — MICROALBUMIN / CREATININE URINE RATIO
Creatinine,U: 299 mg/dL
Microalb Creat Ratio: 0.7 mg/g (ref 0.0–30.0)
Microalb, Ur: 2 mg/dL — ABNORMAL HIGH (ref 0.0–1.9)

## 2022-03-25 LAB — LIPID PANEL
Cholesterol: 126 mg/dL (ref 0–200)
HDL: 52.8 mg/dL (ref >39.00)
LDL Cholesterol: 56 mg/dL (ref 0–99)
NonHDL: 73.69
Total CHOL/HDL Ratio: 2
Triglycerides: 88 mg/dL (ref 0.0–149.0)
VLDL: 17.6 mg/dL (ref 0.0–40.0)

## 2022-03-25 LAB — POCT GLYCOSYLATED HEMOGLOBIN (HGB A1C): HbA1c, POC (prediabetic range): 6.3 % (ref 5.7–6.4)

## 2022-03-25 LAB — BASIC METABOLIC PANEL
BUN: 11 mg/dL (ref 6–23)
CO2: 30 mEq/L (ref 19–32)
Calcium: 9.5 mg/dL (ref 8.4–10.5)
Chloride: 104 mEq/L (ref 96–112)
Creatinine, Ser: 0.83 mg/dL (ref 0.40–1.50)
GFR: 104.85 mL/min (ref >60.00)
Glucose, Bld: 119 mg/dL — ABNORMAL HIGH (ref 70–99)
Potassium: 4.8 mEq/L (ref 3.5–5.1)
Sodium: 139 mEq/L (ref 135–145)

## 2022-03-25 MED ORDER — GLIPIZIDE 5 MG PO TABS: ORAL_TABLET | ORAL | 1 refills | Status: DC

## 2022-03-25 NOTE — Patient Instructions (Addendum)
A few things to remember from today's visit: ? ?Routine general medical examination at a health care facility ? ?Type II diabetes mellitus with neurological manifestations (HCC) - Plan: POC HgB A1c, Microalbumin / creatinine urine ratio ? ?Hypertension, essential, benign - Plan: Basic metabolic panel ? ?Screening for lipoid disorders - Plan: Lipid panel ? ?Today Glipizide was changed from long acting to short acting. ? ?If you need refills please call your pharmacy. ?Do not use My Chart to request refills or for acute issues that need immediate attention. ?  ?Please be sure medication list is accurate. ?If a new problem present, please set up appointment sooner than planned today. ? ?Mantenimiento de [employer] hombres ?Health Maintenance, Male ?Adoptar un estilo de vida saludable y recibir atenci?n preventiva son importantes para promover la salud y Counsellor. Consulte al m?dico sobre: ?El esquema adecuado para [place] pruebas y ex?menes peri?dicos. ?Cosas que puede hacer por su cuenta para prevenir enfermedades y [place] sano. ??Qu? debo saber sobre la dieta, el peso y el ejercicio? ?Consuma una dieta saludable ? ?Consuma una dieta que incluya muchas verduras, frutas, productos l?cteos con bajo contenido de grasa y prote?nas magras. ?No consuma muchos alimentos ricos en grasas s?lidas, az?cares agregados o sodio. ?Mantenga un peso saludable ?El ?ndice de masa muscular [facility]) es una medida que puede utilizarse para identificar posibles problemas de [place]. Proporciona una estimaci?n de la grasa corporal bas?ndose en el peso y la altura. Su m?dico puede ayudarle a Engineer, site [facility]MC y a Personnel officer o Pharmacologist un peso saludable. ?Haga ejercicio con regularidad ?Haga ejercicio con regularidad. Esta es una de las pr?cticas m?s importantes que puede hacer por su salud. La mayor?a de los adultos deben seguir estas pautas: ?Realizar, al menos, 150 minutos de [place] f?sica por semana. El ejercicio debe aumentar la  frecuencia card?aca y Media planner transpirar (ejercicio de intensidad moderada). ?Hacer ejercicios de fortalecimiento por lo [facility] por semana. Agregue esto a su plan de ejercicio de intensidad moderada. ?Pase menos tiempo sentado. Incluso la actividad f?sica ligera puede ser beneficiosa. ?Controle sus niveles de colesterol y l?pidos en la sangre ?Comience a realizarse an?lisis de l?pidos y Oncologist en la sangre a los 20 a?os y luego rep?talos cada 5 a?os. ?Es posible que Insurance underwriter los niveles de colesterol con mayor frecuencia si: ?Sus niveles de l?pidos y colesterol son altos. ?Es mayor de 40 a?os. ?Presenta un alto riesgo de padecer enfermedades card?acas. ??Qu? debo saber sobre las pruebas de detecci?n del c?ncer? ?Muchos tipos de c?ncer pueden detectarse de manera temprana y, a menudo, pueden prevenirse. Seg?n su historia cl?nica y sus antecedentes familiares, es posible que deba realizarse pruebas de detecci?n del c?ncer en diferentes edades. Esto puede incluir pruebas de detecci?n de lo siguiente: ?C?ncer colorrectal. ?C?ncer de pr?stata. ?C?ncer de piel. ?C?ncer de pulm?n. ??Qu? debo saber sobre la enfermedad card?aca, la diabetes y la hipertensi?n arterial? ?Presi?n arterial y enfermedad card?aca ?La hipertensi?n arterial causa enfermedades card?acas y [place] el riesgo de accidente cerebrovascular. Es m?s probable que Immunologist en las personas que tienen lecturas de presi?n arterial alta o tienen sobrepeso. ?Hable con el m?dico sobre sus valores de presi?n arterial deseados. ?H?gase controlar la presi?n arterial: ?Cada 3 a 5 a?os si tiene entre 18 y 14 a?os. ?Todos los a?os si es mayor de 40 a?os. ?Si tiene entre 65 y 5 a?os y es fumador o sol?a fumar, preg?ntele al m?dico si debe realizarse una prueba de detecci?n de aneurisma a?rtico  abdominal (AAA) por ?nica vez. ?Diabetes ?Real?cese ex?menes de detecci?n de la diabetes con regularidad. Este an?lisis revisa el nivel de az?car  en la sangre en [place]. H?gase las pruebas de detecci?n: ?Cada tres a?os despu?s de los 45 a?os de edad si tiene un peso normal y un bajo riesgo de padecer diabetes. ?Con m?s frecuencia y a partir de [place] edad inferior si tiene sobrepeso o un alto riesgo de padecer diabetes. ??Qu? debo saber sobre la prevenci?n de infecciones? ?Hepatitis B ?Si tiene un riesgo m?s alto de Primary school teacher hepatitis B, debe someterse a un examen de detecci?n de este virus. Hable con el m?dico para averiguar si tiene riesgo de contraer la infecci?n por hepatitis B. ?Hepatitis C ?Se recomienda un an?lisis de sangre para: ?[employer] 1946 y 1966. ?Todas las personas que tengan un riesgo de haber contra?do hepatitis C. ?Enfermedades de transmisi?n sexual (ETS) ?Debe realizarse pruebas de detecci?n de ITS todos los a?os, incluidas la gonorrea y la clamidia, si: ?Es sexualmente activo y es menor de 24 a?os. ?Es mayor de 24 a?os, y el m?dico le informa que corre riesgo de tener este tipo de infecciones. ?La actividad sexual ha cambiado desde que le hicieron la ?ltima prueba de detecci?n y tiene un riesgo mayor de tener clamidia o [place]. Preg?ntele al m?dico si usted tiene riesgo. ?Preg?ntele al m?dico si usted tiene un alto riesgo de Primary school teacher VIH. El m?dico tambi?n puede recomendarle un medicamento recetado para ayudar a evitar la infecci?n por el VIH. Si elige tomar medicamentos para prevenir el VIH, primero debe [employer] an?lisis de detecci?n del VIH. Luego debe hacerse an?lisis cada 3 meses mientras est? tomando los [employer]. ?Siga estas indicaciones en su casa: ?Consumo de alcohol ?No beba alcohol si el m?dico se lo proh?be. ?Si bebe alcohol: ?Limite la cantidad que consume de 0 a 2 bebidas por d?a. ?Sepa cu?nta cantidad de alcohol hay en las bebidas que toma. En los [address], una medida equivale a una botella de cerveza de 12 oz (355 ml), un vaso de vino de 5 oz (148 ml) o un vaso de una bebida alcoh?lica de  alta graduaci?n de 1? oz (44 ml). ?Estilo de vida ?No consuma ning?n producto que contenga nicotina o tabaco. Estos productos incluyen cigarrillos, tabaco para mascar y aparatos de vapeo, como los cigarrillos electr?nicos. Si necesita ayuda para dejar de consumir estos productos, consulte al m?dico. ?No consuma drogas. ?No comparta agujas. ?Solicite ayuda a su m?dico si necesita apoyo o informaci?n para abandonar las drogas. ?Indicaciones generales ?Real?cese los estudios de rutina de [address], dentales y de Wellsite geologist. ?Mant?ngase al d?a con las vacunas. ?Inf?rmele a su m?dico si: ?Se siente deprimido con frecuencia. ?Alguna vez ha sido v?ctima de Medical sales representative o no se siente seguro en su casa. ?Resumen ?Adoptar un estilo de vida saludable y recibir atenci?n preventiva son importantes para promover la salud y Counsellor. ?Siga las instrucciones del m?dico acerca de una dieta saludable, el ejercicio y la realizaci?n de pruebas o ex?menes para Hotel manager. ?Siga las instrucciones del m?dico con respecto al control del colesterol y la presi?n arterial. ?Esta informaci?n no tiene Theme park manager el consejo del m?dico. Aseg?rese de hacerle al m?dico cualquier pregunta que tenga. ?Document Revised: 05/02/2021 Document Reviewed: 05/02/2021 ?Elsevier Patient Education ? 2023 Elsevier Inc. ? ?

## 2022-04-03 ENCOUNTER — Ambulatory Visit: Payer: BC Managed Care – PPO | Admitting: Physician Assistant

## 2022-06-24 ENCOUNTER — Encounter: Payer: Self-pay | Admitting: Family Medicine

## 2022-06-24 DIAGNOSIS — E1149 Type 2 diabetes mellitus with other diabetic neurological complication: Secondary | ICD-10-CM

## 2022-06-24 MED ORDER — TRULICITY 1.5 MG/0.5ML ~~LOC~~ SOAJ: SUBCUTANEOUS | 2 refills | Status: DC

## 2022-08-03 DIAGNOSIS — T162XXA Foreign body in left ear, initial encounter: Secondary | ICD-10-CM | POA: Diagnosis not present

## 2022-09-20 NOTE — Progress Notes (Deleted)
HPI: [name] is a 46 y.o. male, who is here today for chronic disease management.  Last seen on 03/25/22  Hypertension:  Medications:*** BP readings at home:*** Side effects:***  Negative for unusual or severe headache, visual changes, exertional chest pain, dyspnea,  focal weakness, or edema.  Lab Results  Component Value Date   CREATININE 0.83 03/25/2022   BUN 11 03/25/2022   NA 139 03/25/2022   K 4.8 03/25/2022   CL 104 03/25/2022   CO2 30 03/25/2022    Diabetes Mellitus II:  - Checking BG at home: *** - Medications: *** - Compliance: *** - Diet: *** - Exercise: *** - eye exam: *** - foot exam: *** - Negative for symptoms of hypoglycemia, polyuria, polydipsia, numbness extremities, foot ulcers/trauma  Lab Results  Component Value Date   HGBA1C 6.3 03/25/2022   Lab Results  Component Value Date   MICROALBUR 2.0 (H) 03/25/2022    Review of Systems Rest of ROS see pertinent positives and negatives in HPI.  Current Outpatient Medications on File Prior to Visit  Medication Sig Dispense Refill   Blood Glucose Monitoring Suppl (ONE TOUCH ULTRA 2) w/Device KIT 1 Device by Does not apply route 2 (two) times daily. 1 each 0   diclofenac (VOLTAREN) 75 MG EC tablet Take 75 mg by mouth 2 (two) times daily.     Dulaglutide (TRULICITY) 1.5 HY/8.6VH SOPN INJECT 1.5 MG UNDER THE SKIN ONCE A WEEK 6 mL 2   fexofenadine (ALLEGRA) 180 MG tablet Take 180 mg by mouth daily.     glipiZIDE (GLUCOTROL) 5 MG tablet Daily  before supper 90 tablet 1   glucose blood test strip Use as instructed 180 each 3   Lancets (ONETOUCH ULTRASOFT) lancets Use as instructed 180 each 3   omeprazole (PRILOSEC) 40 MG capsule Take 1 capsule (40 mg total) by mouth daily. 90 capsule 1   pravastatin (PRAVACHOL) 10 MG tablet TAKE 1 TABLET DAILY 90 tablet 3   tiZANidine (ZANAFLEX) 4 MG tablet Take 2 mg by mouth 3 (three) times daily as needed.     No current facility-administered medications  on file prior to visit.    Past Medical History:  Diagnosis Date   Allergy    seasonal   Chicken pox    Diabetes mellitus without complication ([place])    Hypertension    No Known Allergies  Social History   Socioeconomic History   Marital status: Single    Spouse name: Not on file   Number of children: Not on file   Years of education: Not on file   Highest education level: Not on file  Occupational History   Not on file  Tobacco Use   Smoking status: Every Day    Types: E-cigarettes   Smokeless tobacco: Never   Tobacco comments:    Vapes daily  Vaping Use   Vaping Use: Every day   Substances: Nicotine, Flavoring  Substance and Sexual Activity   Alcohol use: Not Currently   Drug use: Never   Sexual activity: Yes    Partners: Female  Other Topics Concern   Not on file  Social History Narrative   Not on file   Social Determinants of Health   Financial Resource Strain: Not on file  Food Insecurity: Not on file  Transportation Needs: Not on file  Physical Activity: Not on file  Stress: Not on file  Social Connections: Not on file    There were no vitals filed for  this visit. There is no height or weight on file to calculate BMI.  Physical Exam  ASSESSMENT AND PLAN:  There are no diagnoses linked to this encounter.  No orders of the defined types were placed in this encounter.   No problem-specific Assessment & Plan notes found for this encounter.   No follow-ups on file.  Betty G. [place], MD  [facility]. [place] office.

## 2022-09-23 ENCOUNTER — Ambulatory Visit: Payer: BC Managed Care – PPO | Admitting: Family Medicine

## 2022-09-23 DIAGNOSIS — E1149 Type 2 diabetes mellitus with other diabetic neurological complication: Secondary | ICD-10-CM

## 2022-09-23 DIAGNOSIS — I10 Essential (primary) hypertension: Secondary | ICD-10-CM

## 2022-09-27 NOTE — Progress Notes (Signed)
HPI: [name] is a 46 y.o. male, who is here today for chronic disease management.  Last seen on 03/25/22 for his CPE. DM II: Dx'ed in 2000. Mitchell Oneill reports that Mitchell Oneill does not monitor his blood sugar levels regularly,Mitchell Oneill does not like to stock his fingers. Mitchell Oneill would like to try a continues glucose monitoring system.  Mitchell Oneill sometimes Mitchell Oneill feels "weird", like shaking. Mitchell Oneill thinks it is caused by high BS's. Mitchell Oneill has not checked BP's. Mitchell Oneill works outdoors, Mitchell Oneill tries to stay adequately hydrated.  Mitchell Oneill is currently taking Glipizide 5mg  on an as-needed basis, when Mitchell Oneill feels unwell, "weird", and takes Trulicity  1.5 mg weekly. Mitchell Oneill admits to not eating regularly during the day, skips lunch, snacks on peanuts, cookies, and fruit-flavored coconut water.  Mitchell Oneill reports having dental issues, including a recent root canal and tooth extraction.  Mitchell Oneill has not had an eye examination in the past year due to insurance coverage and expenses related to his dental treatment.  Negative for abdominal pain, nausea,vomiting, polydipsia,polyuria, or polyphagia.  Lab Results  Component Value Date   HGBA1C 6.3 03/25/2022   Review of Systems  Constitutional:  Negative for activity change, appetite change and fever.  HENT:  Negative for nosebleeds and sore throat.   Eyes:  Negative for redness and visual disturbance.  Respiratory:  Negative for cough, shortness of breath and wheezing.   Cardiovascular:  Negative for chest pain, palpitations and leg swelling.  Gastrointestinal:  Negative for abdominal pain.       Negative for changes in bowel habits.  Genitourinary:  Negative for decreased urine volume and hematuria.  Neurological:  Negative for syncope, weakness and headaches.  Rest of ROS: See pertinent positives and negatives in HPI.  Current Outpatient Medications on File Prior to Visit  Medication Sig Dispense Refill   Blood Glucose Monitoring Suppl (ONE TOUCH ULTRA 2) w/Device KIT 1 Device by Does not apply route 2 (two) times  daily. 1 each 0   diclofenac (VOLTAREN) 75 MG EC tablet Take 75 mg by mouth 2 (two) times daily.     Dulaglutide (TRULICITY) 1.5 MG/0.5ML SOPN INJECT 1.5 MG UNDER THE SKIN ONCE A WEEK 6 mL 2   fexofenadine (ALLEGRA) 180 MG tablet Take 180 mg by mouth daily.     glucose blood test strip Use as instructed 180 each 3   Lancets (ONETOUCH ULTRASOFT) lancets Use as instructed 180 each 3   omeprazole (PRILOSEC) 40 MG capsule Take 1 capsule (40 mg total) by mouth daily. 90 capsule 1   pravastatin (PRAVACHOL) 10 MG tablet TAKE 1 TABLET DAILY 90 tablet 3   tiZANidine (ZANAFLEX) 4 MG tablet Take 2 mg by mouth 3 (three) times daily as needed.     No current facility-administered medications on file prior to visit.   Past Medical History:  Diagnosis Date   Allergy    seasonal   Chicken pox    Diabetes mellitus without complication (HCC)    Hypertension    No Known Allergies  Social History   Socioeconomic History   Marital status: Single    Spouse name: Not on file   Number of children: Not on file   Years of education: Not on file   Highest education level: Not on file  Occupational History   Not on file  Tobacco Use   Smoking status: Every Day    Types: E-cigarettes   Smokeless tobacco: Never   Tobacco comments:    Vapes daily  Vaping Use  Vaping Use: Every day   Substances: Nicotine, Flavoring  Substance and Sexual Activity   Alcohol use: Not Currently   Drug use: Never   Sexual activity: Yes    Partners: Female  Other Topics Concern   Not on file  Social History Narrative   Not on file   Social Determinants of Health   Financial Resource Strain: Not on file  Food Insecurity: Not on file  Transportation Needs: Not on file  Physical Activity: Not on file  Stress: Not on file  Social Connections: Not on file   Vitals:   09/30/22 1206  BP: 120/84  Pulse: 90  Resp: 12  Temp: 98.9 F (37.2 C)  SpO2: 99%   Body mass index is 31.4 kg/m.  Physical Exam Vitals  and nursing note reviewed.  Constitutional:      General: Mitchell Oneill is not in acute distress.    Appearance: Mitchell Oneill is well-developed and well-groomed.  HENT:     Head: Normocephalic and atraumatic.     Mouth/Throat:     Mouth: Mucous membranes are moist.     Pharynx: Oropharynx is clear.  Eyes:     Conjunctiva/sclera: Conjunctivae normal.  Cardiovascular:     Rate and Rhythm: Normal rate and regular rhythm.     Pulses:          Dorsalis pedis pulses are 2+ on the right side and 2+ on the left side.     Heart sounds: No murmur heard. Pulmonary:     Effort: Pulmonary effort is normal. No respiratory distress.     Breath sounds: Normal breath sounds.  Abdominal:     Palpations: Abdomen is soft. There is no hepatomegaly or mass.     Tenderness: There is no abdominal tenderness.  Lymphadenopathy:     Cervical: No cervical adenopathy.  Skin:    General: Skin is warm.     Findings: No erythema or rash.  Neurological:     Mental Status: Mitchell Oneill is alert and oriented to person, place, and time.     Cranial Nerves: No cranial nerve deficit.     Gait: Gait normal.  Psychiatric:        Mood and Affect: Affect normal. Mood is anxious.   ASSESSMENT AND PLAN:  [name] was seen today for follow-up.  Diagnoses and all orders for this visit: Orders Placed This Encounter  Procedures   Flu Vaccine QUAD 45mo+IM (Fluarix, Fluzone & Alfiuria Quad PF)   POC HgB A1c   Lab Results  Component Value Date   HGBA1C 6.1 09/30/2022   Type II diabetes mellitus with neurological manifestations (HCC) HgA1C at goal, it went from 6.3 to 6.1. Recommend stopping Glipizide. No changes in trulicity dose. Start monitoring BS's, [facility] Rx sent.  Regular exercise and healthy diet with avoidance of added sugar food intake is an important part of treatment and recommended. Annual eye exam, periodic dental and foot care recommended. F/U in 5-6 months  -     Continuous Blood Gluc Transmit (DEXCOM G6 TRANSMITTER) MISC; 1  Device by Does not apply route every 3 (three) months. -     Continuous Blood Gluc Sensor (DEXCOM G6 SENSOR) MISC; Change sensor q 10 days.  Hypoglycemia Based os reported symptoms. Strongly recommend avoiding skipping meals. Glipizide discontinued today.  -     Continuous Blood Gluc Transmit (DEXCOM G6 TRANSMITTER) MISC; 1 Device by Does not apply route every 3 (three) months. -     Continuous Blood Gluc Sensor (DEXCOM G6 SENSOR)  MISC; Change sensor q 10 days.  Need for influenza vaccination -     Flu Vaccine QUAD 17mo+IM (Fluarix, Fluzone & Alfiuria Quad PF)  Return in about 4 months (around 01/31/2023).  Tamiyah Moulin G. [place], MD  [facility]. [place] office.

## 2022-09-30 ENCOUNTER — Encounter: Payer: Self-pay | Admitting: Family Medicine

## 2022-09-30 ENCOUNTER — Ambulatory Visit: Payer: BC Managed Care – PPO | Admitting: Family Medicine

## 2022-09-30 VITALS — BP 120/84 | HR 90 | Temp 98.9°F | Resp 12 | Ht 67.0 in | Wt 200.5 lb

## 2022-09-30 DIAGNOSIS — E1149 Type 2 diabetes mellitus with other diabetic neurological complication: Secondary | ICD-10-CM

## 2022-09-30 DIAGNOSIS — E162 Hypoglycemia, unspecified: Secondary | ICD-10-CM | POA: Diagnosis not present

## 2022-09-30 DIAGNOSIS — Z23 Encounter for immunization: Secondary | ICD-10-CM

## 2022-09-30 LAB — POCT GLYCOSYLATED HEMOGLOBIN (HGB A1C): HbA1c, POC (prediabetic range): 6.1 % (ref 5.7–6.4)

## 2022-09-30 MED ORDER — DEXCOM G6 TRANSMITTER MISC: 1.0000 | 3 refills | Status: AC

## 2022-09-30 MED ORDER — DEXCOM G6 SENSOR MISC: 1 refills | Status: DC

## 2022-09-30 NOTE — Patient Instructions (Addendum)
A few things to remember from today's visit:   Type II diabetes mellitus with neurological manifestations ([place]) - Plan: POC HgB A1c, Continuous Blood Gluc Transmit (DEXCOM G6 TRANSMITTER) MISC, Continuous Blood Gluc Sensor (DEXCOM G6 SENSOR) MISC  Hypoglycemia - Plan: Continuous Blood Gluc Transmit (DEXCOM G6 TRANSMITTER) MISC, Continuous Blood Gluc Sensor (DEXCOM G6 SENSOR) MISC  Stop Glipizide. Continue Trulicity. Check blood sugars regularly.  If you need refills for medications you take chronically, please call your pharmacy. Do not use My Chart to request refills or for acute issues that need immediate attention. If you send a my chart message, it may take a few days to be addressed, specially if I am not in the office.  Please be sure medication list is accurate. If a new problem present, please set up appointment sooner than planned today.

## 2023-01-31 NOTE — Progress Notes (Unsigned)
HPI: Mitchell Oneill is a 47 y.o. male, who is here today for chronic disease management.  Last seen on 09/30/22. No new problems since his last visit.  Diabetes Mellitus II:  Dx'ed in 2000.  Currently he is on Trulicity 1.5 mg weekly. Not checking BS's. Last eye exam 2 years ago. Negative for symptoms of hypoglycemia, polyuria, polydipsia, numbness extremities, foot ulcers/trauma  Lab Results  Component Value Date   MICROALBUR 2.0 (H) 03/25/2022   Hypertension: He is on non pharmacologic treatment. BP mildly elevated today. He does not check BP regularly. Negative for unusual or severe headache, visual changes, exertional chest pain, dyspnea,  focal weakness, or edema.  Lab Results  Component Value Date   CREATININE 0.83 03/25/2022   BUN 11 03/25/2022   NA 139 03/25/2022   K 4.8 03/25/2022   CL 104 03/25/2022   CO2 30 03/25/2022   Review of Systems  Constitutional:  Negative for chills and fever.  HENT:  Negative for mouth sores, nosebleeds and sore throat.   Respiratory:  Negative for cough and wheezing.   Gastrointestinal:  Negative for abdominal pain, nausea and vomiting.  Skin:  Negative for rash.  Neurological:  Negative for syncope and facial asymmetry.  See other pertinent positives and negatives in HPI.  Current Outpatient Medications on File Prior to Visit  Medication Sig Dispense Refill   Blood Glucose Monitoring Suppl (ONE TOUCH ULTRA 2) w/Device KIT 1 Device by Does not apply route 2 (two) times daily. 1 each 0   Continuous Blood Gluc Sensor (DEXCOM G6 SENSOR) MISC Change sensor q 10 days. 3 each 1   Continuous Blood Gluc Transmit (DEXCOM G6 TRANSMITTER) MISC 1 Device by Does not apply route every 3 (three) months. 1 each 3   diclofenac (VOLTAREN) 75 MG EC tablet Take 75 mg by mouth 2 (two) times daily.     Dulaglutide (TRULICITY) 1.5 0000000 SOPN INJECT 1.5 MG UNDER THE SKIN ONCE A WEEK 6 mL 2   fexofenadine (ALLEGRA) 180 MG tablet Take 180 mg by mouth  daily.     glucose blood test strip Use as instructed 180 each 3   Lancets (ONETOUCH ULTRASOFT) lancets Use as instructed 180 each 3   omeprazole (PRILOSEC) 40 MG capsule Take 1 capsule (40 mg total) by mouth daily. 90 capsule 1   pravastatin (PRAVACHOL) 10 MG tablet TAKE 1 TABLET DAILY 90 tablet 3   tiZANidine (ZANAFLEX) 4 MG tablet Take 2 mg by mouth 3 (three) times daily as needed.     No current facility-administered medications on file prior to visit.   Past Medical History:  Diagnosis Date   Allergy    seasonal   Chicken pox    Diabetes mellitus without complication ([place])    Hypertension    No Known Allergies  Social History   Socioeconomic History   Marital status: Single    Spouse name: Not on file   Number of children: Not on file   Years of education: Not on file   Highest education level: Not on file  Occupational History   Not on file  Tobacco Use   Smoking status: Every Day    Types: E-cigarettes   Smokeless tobacco: Never   Tobacco comments:    Vapes daily  Vaping Use   Vaping Use: Every day   Substances: Nicotine, Flavoring  Substance and Sexual Activity   Alcohol use: Not Currently   Drug use: Never   Sexual activity: Yes    Partners:  Female  Other Topics Concern   Not on file  Social History Narrative   Not on file   Social Determinants of Health   Financial Resource Strain: Unknown (02/03/2023)   Overall Financial Resource Strain (CARDIA)    Difficulty of Paying Living Expenses: Patient refused  Food Insecurity: Unknown (02/03/2023)   Hunger Vital Sign    Worried About Running Out of Food in the Last Year: Patient refused    [place] in the Last Year: Patient refused  Transportation Needs: Unknown (02/03/2023)   [place] - Transportation    Lack of Transportation (Medical): Patient refused    Lack of Transportation (Non-Medical): Patient refused  Physical Activity: Unknown (02/03/2023)   Exercise Vital Sign    Days of Exercise per  Week: Patient refused    Minutes of Exercise per Session: Not on file  Stress: No Stress Concern Present (02/03/2023)   [employer] [place] of Stress : Not at all  Social Connections: Unknown (02/03/2023)   Social Connection and Isolation Panel [NHANES]    Frequency of Communication with Friends and Family: Patient refused    Frequency of Social Gatherings with Friends and Family: Patient refused    Attends Religious Services: Patient refused    Active Member of Clubs or Organizations: No    Attends Archivist Meetings: Not on file    Marital Status: Patient refused   Vitals:   02/03/23 1137 02/03/23 1205  BP: (!) 140/90 (!) 138/90  Pulse: 94   Resp: 12   Temp: 98.8 F (37.1 C)   SpO2: 98%    Body mass index is 32.01 kg/m.  Physical Exam Vitals and nursing note reviewed.  Constitutional:      General: He is not in acute distress.    Appearance: He is well-developed.  HENT:     Head: Normocephalic and atraumatic.     Mouth/Throat:     Mouth: Mucous membranes are moist.     Pharynx: Oropharynx is clear.  Eyes:     Conjunctiva/sclera: Conjunctivae normal.  Cardiovascular:     Rate and Rhythm: Normal rate and regular rhythm.     Pulses:          Dorsalis pedis pulses are 2+ on the right side and 2+ on the left side.     Heart sounds: No murmur heard. Pulmonary:     Effort: Pulmonary effort is normal. No respiratory distress.     Breath sounds: Normal breath sounds.  Abdominal:     Palpations: Abdomen is soft. There is no hepatomegaly or mass.     Tenderness: There is no abdominal tenderness.  Lymphadenopathy:     Cervical: No cervical adenopathy.  Skin:    General: Skin is warm.     Findings: No erythema or rash.  Neurological:     Mental Status: He is alert and oriented to person, place, and time.     Cranial Nerves: No cranial nerve deficit.     Gait: Gait normal.  Psychiatric:         Mood and Affect: Mood and affect normal.   ASSESSMENT AND PLAN:  Mitchell Oneill was seen today for medical management of chronic issues.  Diagnoses and all orders for this visit: Lab Results  Component Value Date   HGBA1C 6.9 02/03/2023   Lab Results  Component Value Date   MICROALBUR 0.9 02/03/2023   MICROALBUR 2.0 (H) 03/25/2022  Type II diabetes  mellitus with neurological manifestations [facility]) Assessment & Plan: HgA1C is at goal, 6.9 today. Continue Trulicity 1.5 mg weekly. Annual eye exam, periodic dental and foot care recommended. F/U in 5-6 months  Orders: -     POCT glycosylated hemoglobin (Hb A1C) -     Basic metabolic panel; Future -     Microalbumin / creatinine urine ratio; Future  Hypertension, essential, benign Assessment & Plan: Re-checked 138/90. For now continue non pharmacologic treatment. Monitor BP at home and if persistently 140/90 or higher, we need to consider pharmacologic treatment. We discussed possible complications of poorly controlled HTN. He has an eye exam in 02/2023.  Orders: -     Basic metabolic panel; Future  Return in about 5 months (around 07/04/2023).  Kendalyn Cranfield G. [place], MD  [facility]. [place] office.

## 2023-02-03 ENCOUNTER — Ambulatory Visit (INDEPENDENT_AMBULATORY_CARE_PROVIDER_SITE_OTHER): Payer: BC Managed Care – PPO | Admitting: Family Medicine

## 2023-02-03 ENCOUNTER — Encounter: Payer: Self-pay | Admitting: Family Medicine

## 2023-02-03 VITALS — BP 138/90 | HR 94 | Temp 98.8°F | Resp 12 | Ht 67.0 in | Wt 204.4 lb

## 2023-02-03 DIAGNOSIS — I10 Essential (primary) hypertension: Secondary | ICD-10-CM | POA: Diagnosis not present

## 2023-02-03 DIAGNOSIS — E1149 Type 2 diabetes mellitus with other diabetic neurological complication: Secondary | ICD-10-CM | POA: Diagnosis not present

## 2023-02-03 LAB — POCT GLYCOSYLATED HEMOGLOBIN (HGB A1C): HbA1c, POC (controlled diabetic range): 6.9 % (ref 0.0–7.0)

## 2023-02-03 LAB — MICROALBUMIN / CREATININE URINE RATIO
Creatinine,U: 204.9 mg/dL
Microalb Creat Ratio: 0.4 mg/g (ref 0.0–30.0)
Microalb, Ur: 0.9 mg/dL (ref 0.0–1.9)

## 2023-02-03 NOTE — Patient Instructions (Addendum)
A few things to remember from today's visit:  Type II diabetes mellitus with neurological manifestations ([place]) - Plan: POC HgB [id number], Basic metabolic panel, Microalbumin / creatinine urine ratio  Hypertension, essential, benign - Plan: Basic metabolic panel  Monitor blood pressure at home. Goal under 140/90 ideally 130/80 or lower.  If you need refills for medications you take chronically, please call your pharmacy. Do not use My Chart to request refills or for acute issues that need immediate attention. If you send a my chart message, it may take a few days to be addressed, specially if I am not in the office.  Please be sure medication list is accurate. If a new problem present, please set up appointment sooner than planned today.

## 2023-02-03 NOTE — Assessment & Plan Note (Signed)
Re-checked 138/90. For now continue non pharmacologic treatment. Monitor BP at home and if persistently 140/90 or higher, we need to consider pharmacologic treatment. We discussed possible complications of poorly controlled HTN. He has an eye exam in 02/2023.

## 2023-02-03 NOTE — Assessment & Plan Note (Signed)
HgA1C is at goal. No changes in current management. Annual eye exam, periodic dental and foot care recommended. F/U in 5-6 months

## 2023-02-10 ENCOUNTER — Encounter: Payer: Self-pay | Admitting: Family Medicine

## 2023-02-18 ENCOUNTER — Other Ambulatory Visit: Payer: Self-pay | Admitting: Family Medicine

## 2023-02-18 DIAGNOSIS — E1149 Type 2 diabetes mellitus with other diabetic neurological complication: Secondary | ICD-10-CM

## 2023-02-18 MED ORDER — GLIPIZIDE 5 MG PO TABS: ORAL_TABLET | ORAL | 0 refills | Status: DC

## 2023-02-18 NOTE — Telephone Encounter (Signed)
Pt wife jennifer is calling and trulicity is on backorder and he has glipizide on hand should he take that medicationn. Please advise. Pt took his last trulicity injection on [id number]

## 2023-02-24 DIAGNOSIS — E119 Type 2 diabetes mellitus without complications: Secondary | ICD-10-CM | POA: Diagnosis not present

## 2023-02-24 LAB — HM DIABETES EYE EXAM

## 2023-03-10 DIAGNOSIS — M25532 Pain in left wrist: Secondary | ICD-10-CM | POA: Diagnosis not present

## 2023-03-17 DIAGNOSIS — M25531 Pain in right wrist: Secondary | ICD-10-CM | POA: Diagnosis not present

## 2023-04-09 DIAGNOSIS — G8918 Other acute postprocedural pain: Secondary | ICD-10-CM | POA: Diagnosis not present

## 2023-04-09 DIAGNOSIS — Y999 Unspecified external cause status: Secondary | ICD-10-CM | POA: Diagnosis not present

## 2023-04-09 DIAGNOSIS — S63592A Other specified sprain of left wrist, initial encounter: Secondary | ICD-10-CM | POA: Diagnosis not present

## 2023-04-09 DIAGNOSIS — X58XXXA Exposure to other specified factors, initial encounter: Secondary | ICD-10-CM | POA: Diagnosis not present

## 2023-04-09 HISTORY — PX: WRIST SURGERY: SHX841

## 2023-04-16 DIAGNOSIS — M25632 Stiffness of left wrist, not elsewhere classified: Secondary | ICD-10-CM | POA: Diagnosis not present

## 2023-04-16 DIAGNOSIS — Z789 Other specified health status: Secondary | ICD-10-CM | POA: Diagnosis not present

## 2023-04-16 DIAGNOSIS — M25532 Pain in left wrist: Secondary | ICD-10-CM | POA: Diagnosis not present

## 2023-04-17 IMAGING — DX DG LUMBAR SPINE COMPLETE 4+V
5 series · 5 of 5 positions shown · non-contrast
Comparison: None.

CLINICAL DATA: Back pain

EXAM:
LUMBAR SPINE - COMPLETE 4+ VIEW

[lumbar spine ap]
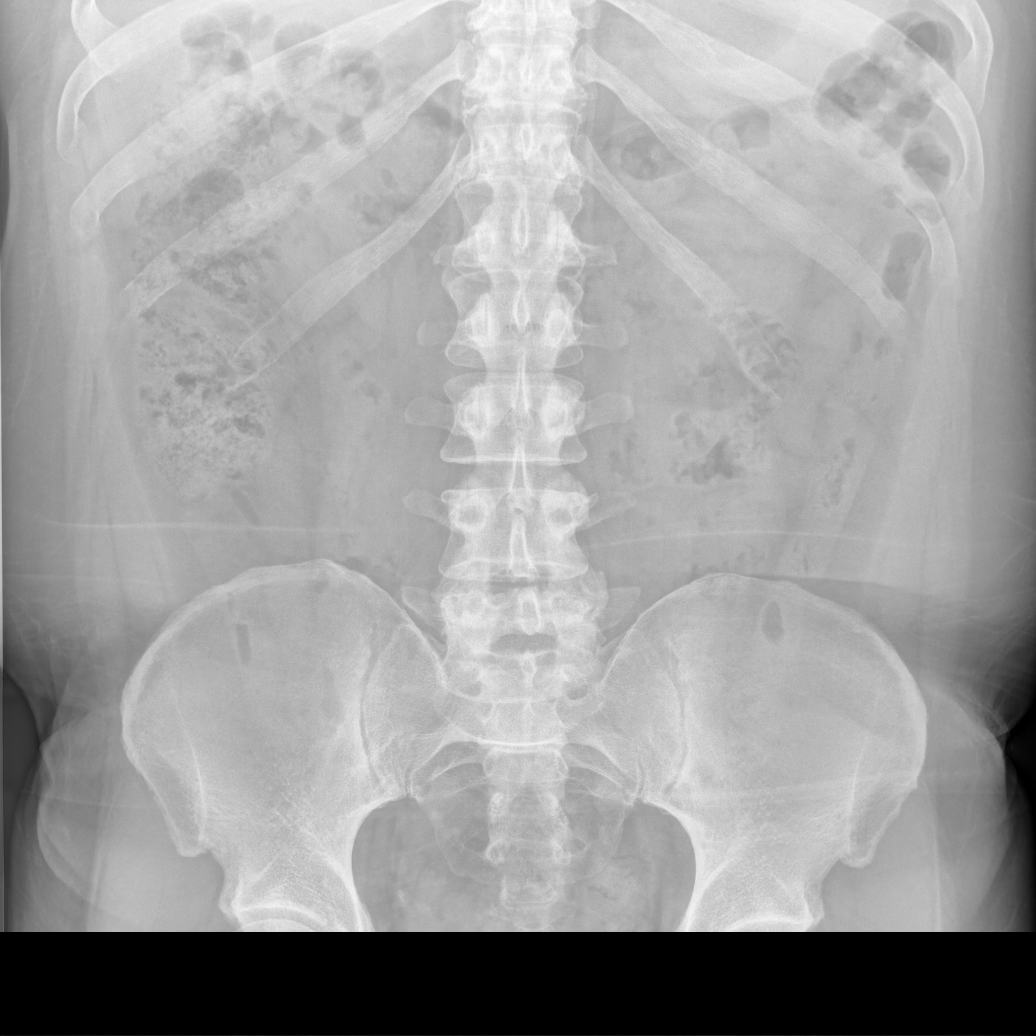

[lumbar spine oblique (1 of 2)]
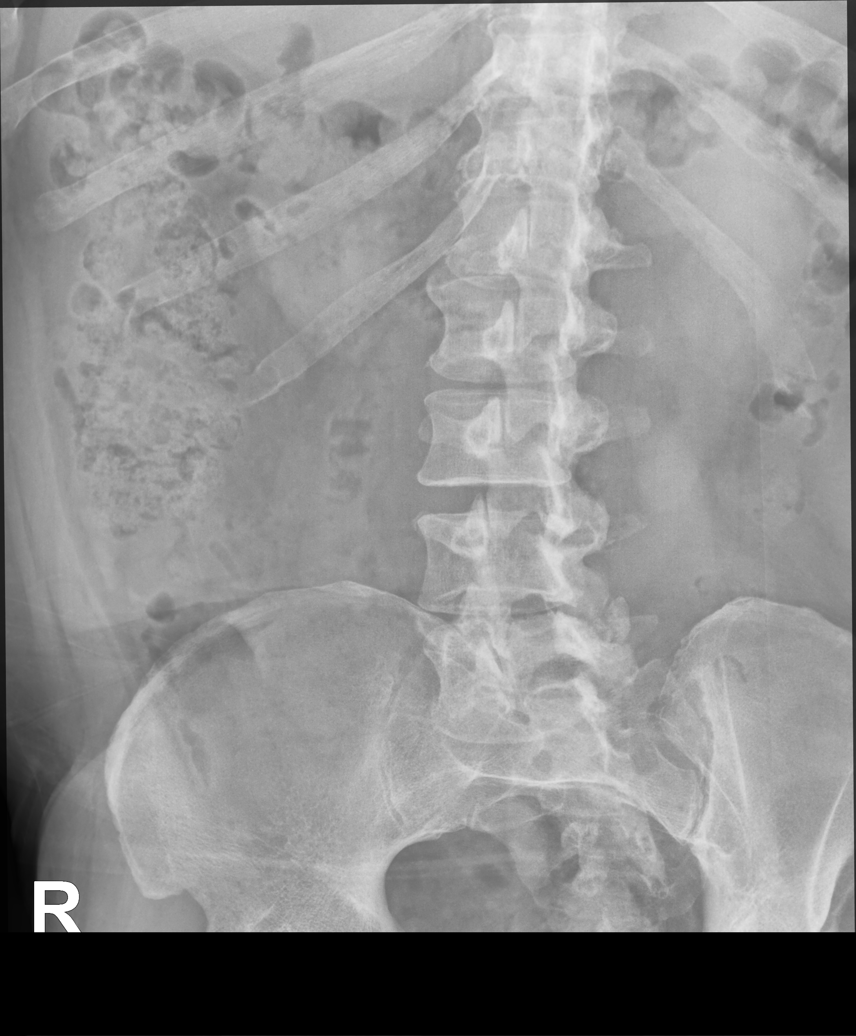

[lumbar spine oblique (2 of 2)]
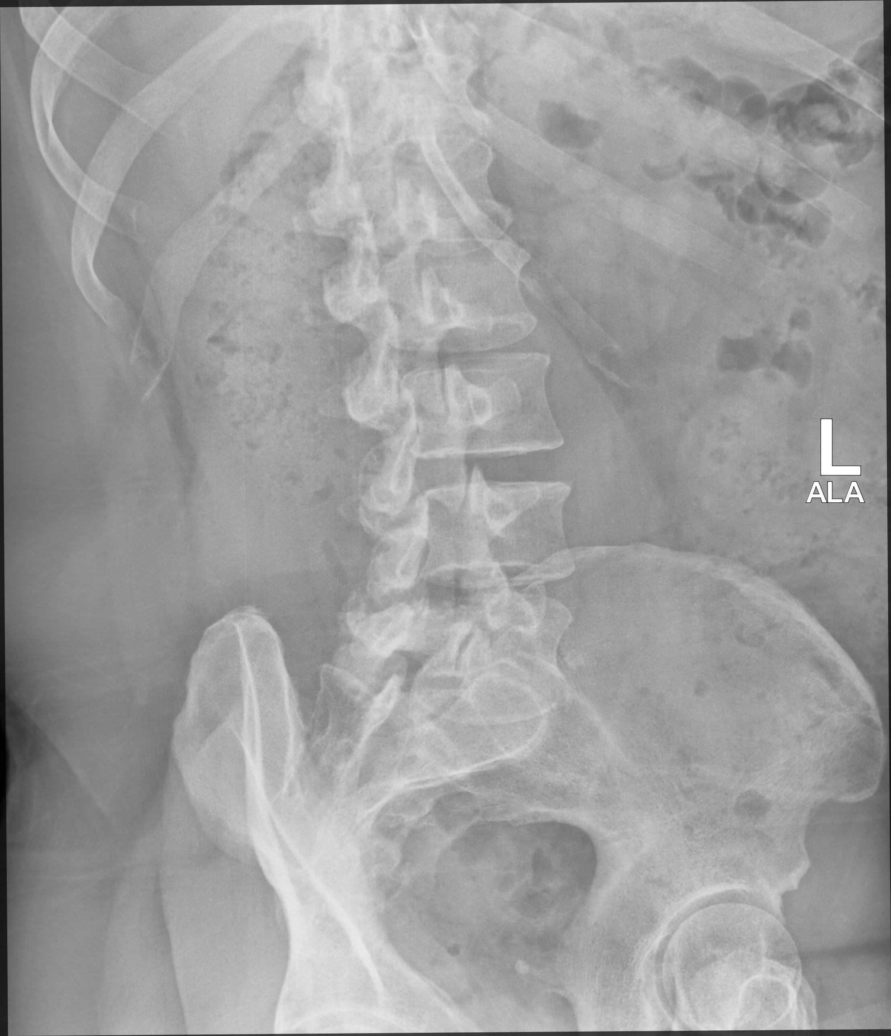

[lumbar spine lat (1 of 2)]
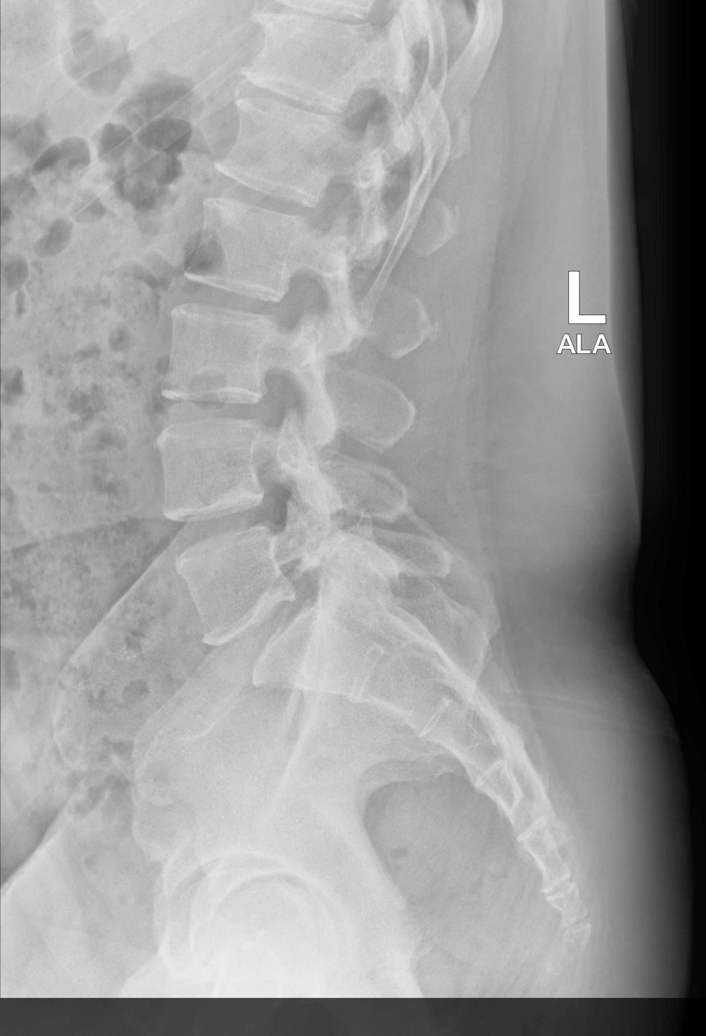

[lumbar spine lat (2 of 2)]
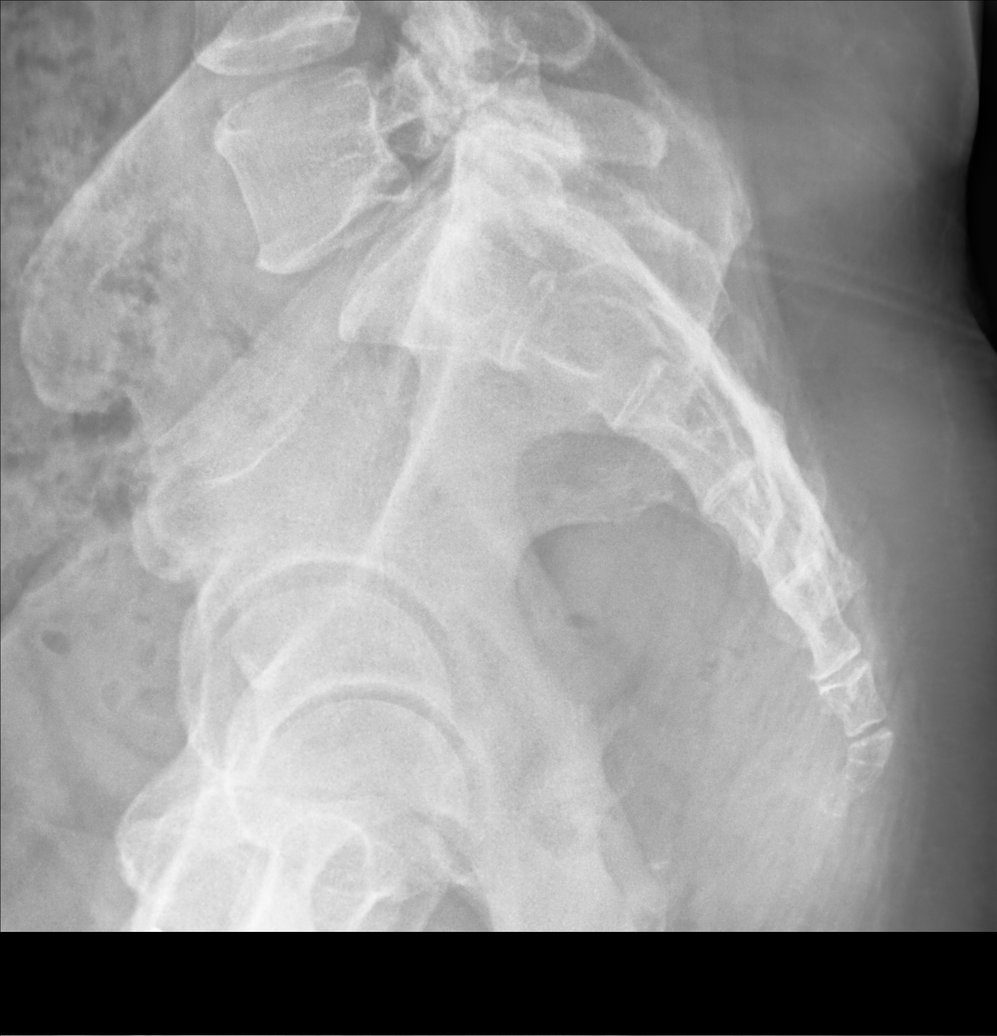

[5 of 5 positions shown; findings below may reference images not displayed]

FINDINGS: No recent fracture is seen. There is minimal anterolisthesis at
L5-S1 level. There is no significant disc space narrowing.
Spondylolysis is seen in the L5 vertebra. Small anterior bony spurs
seen. Degenerative changes are noted in facet joints, more so at
L4-L5 and L5-S1 levels.
IMPRESSION: No recent fracture is seen. Degenerative changes are noted with bony
spurs and facet hypertrophy, more severe at L4-L5 and L5-S1 levels.
Spondylolysis is seen in the L5 vertebra with minimal
spondylolisthesis at L5-S1 level.

## 2023-04-23 DIAGNOSIS — S638X2D Sprain of other part of left wrist and hand, subsequent encounter: Secondary | ICD-10-CM | POA: Diagnosis not present

## 2023-05-13 DIAGNOSIS — S638X2D Sprain of other part of left wrist and hand, subsequent encounter: Secondary | ICD-10-CM | POA: Diagnosis not present

## 2023-05-21 ENCOUNTER — Other Ambulatory Visit: Payer: Self-pay | Admitting: Family Medicine

## 2023-05-21 DIAGNOSIS — E1149 Type 2 diabetes mellitus with other diabetic neurological complication: Secondary | ICD-10-CM

## 2023-06-05 DIAGNOSIS — S638X2D Sprain of other part of left wrist and hand, subsequent encounter: Secondary | ICD-10-CM | POA: Diagnosis not present

## 2023-07-04 DIAGNOSIS — S638X2D Sprain of other part of left wrist and hand, subsequent encounter: Secondary | ICD-10-CM | POA: Diagnosis not present

## 2023-07-04 NOTE — Progress Notes (Unsigned)
HPI: [name] is a 47 y.o. male, who is here today for chronic disease management.  Last seen on 02/03/23. Since his last visit, he had left wrist injury,left scapholunate ligament tear, has not been able to work for a couple months.  -Diabetes Mellitus II: Dx'ed in 2000. He was taking Glipizide 5 mg until a months ago because could not get Trulicity. Resumed Trulicity 1.5 mg a month ago. His blood sugar levels have been between 180 and 200 since starting Trulicity. Negative for symptoms of hypoglycemia, polyuria, polydipsia, numbness extremities, foot ulcers/trauma Last eye exam 02/2023.  Lab Results  Component Value Date   HGBA1C 6.9 02/03/2023   Lab Results  Component Value Date   MICROALBUR 0.9 02/03/2023   -HTN: He is on non pharmacologic treatment. He is not checking BP regularly. Denies severe/frequent headache, visual changes, chest pain, dyspnea, palpitation, focal weakness, or edema.  -He has had two recent episodes of nausea, vomiting, and diarrhea after consuming meat, with the most recent episode occurring after eating spaghetti with meatballs.  + Burping. Mild abdominal cramps, resolved after defecation. He has a history of similar episodes dating back several years. He has not seen blood in stool and negative for melena.  Hx of IBS-D. Colonoscopy 02/2009.  Review of Systems  Constitutional:  Negative for chills and fever.  HENT:  Negative for sore throat and trouble swallowing.   Respiratory:  Negative for cough and wheezing.   Endocrine: Negative for cold intolerance and heat intolerance.  Musculoskeletal:  Positive for arthralgias.  Neurological:  Negative for syncope and facial asymmetry.  Psychiatric/Behavioral:  Negative for confusion and hallucinations. The patient is nervous/anxious.   See other pertinent positives and negatives in HPI.  Current Outpatient Medications on File Prior to Visit  Medication Sig Dispense Refill   Blood Glucose  Monitoring Suppl (ONE TOUCH ULTRA 2) w/Device KIT 1 Device by Does not apply route 2 (two) times daily. 1 each 0   Continuous Blood Gluc Sensor (DEXCOM G6 SENSOR) MISC Change sensor q 10 days. 3 each 1   Continuous Blood Gluc Transmit (DEXCOM G6 TRANSMITTER) MISC 1 Device by Does not apply route every 3 (three) months. 1 each 3   diclofenac (VOLTAREN) 75 MG EC tablet Take 75 mg by mouth 2 (two) times daily.     Dulaglutide (TRULICITY) 1.5 MG/0.5ML SOPN Inject 1.5 mg into the skin once a week.     fexofenadine (ALLEGRA) 180 MG tablet Take 180 mg by mouth daily.     glucose blood test strip Use as instructed 180 each 3   Lancets (ONETOUCH ULTRASOFT) lancets Use as instructed 180 each 3   omeprazole (PRILOSEC) 40 MG capsule Take 1 capsule (40 mg total) by mouth daily. 90 capsule 1   pravastatin (PRAVACHOL) 10 MG tablet TAKE 1 TABLET DAILY 90 tablet 3   tiZANidine (ZANAFLEX) 4 MG tablet Take 2 mg by mouth 3 (three) times daily as needed.     No current facility-administered medications on file prior to visit.   Past Medical History:  Diagnosis Date   Allergy    seasonal   Chicken pox    Diabetes mellitus without complication (HCC)    Hypertension    No Known Allergies  Social History   Socioeconomic History   Marital status: Single    Spouse name: Not on file   Number of children: Not on file   Years of education: Not on file   Highest education level: Not on file  Occupational  History   Not on file  Tobacco Use   Smoking status: Every Day    Types: E-cigarettes   Smokeless tobacco: Never   Tobacco comments:    Vapes daily  Vaping Use   Vaping status: Every Day   Substances: Nicotine, Flavoring  Substance and Sexual Activity   Alcohol use: Not Currently   Drug use: Never   Sexual activity: Yes    Partners: Female  Other Topics Concern   Not on file  Social History Narrative   Not on file   Social Determinants of Health   Financial Resource Strain: Patient Declined  (02/03/2023)   Overall Financial Resource Strain (CARDIA)    Difficulty of Paying Living Expenses: Patient declined  Food Insecurity: Patient Declined (02/03/2023)   Hunger Vital Sign    Worried About Running Out of Food in the Last Year: Patient declined    Ran Out of Food in the Last Year: Patient declined  Transportation Needs: Patient Declined (02/03/2023)   PRAP[employer] - Administrator, Civil Service (Medical): Patient declined    Lack of Transportation (Non-Medical): Patient declined  Physical Activity: Unknown (02/03/2023)   Exercise Vital Sign    Days of Exercise per Week: Patient declined    Minutes of Exercise per Session: Not on file  Stress: No Stress Concern Present (02/03/2023)   [employer] of Occupational Health - Occupational Stress Questionnaire    Feeling of Stress : Not at all  Social Connections: Unknown (02/03/2023)   Social Connection and Isolation Panel [NHANES]    Frequency of Communication with Friends and Family: Patient declined    Frequency of Social Gatherings with Friends and Family: Patient declined    Attends Religious Services: Patient declined    Database administrator or Organizations: No    Attends Banker Meetings: Not on file    Marital Status: Patient declined   Vitals:   07/07/23 1345  BP: 120/80  Pulse: 100  Resp: 12  Temp: 99.1 F (37.3 C)  SpO2: 99%   Body mass index is 31.21 kg/m.  Physical Exam Vitals and nursing note reviewed.  Constitutional:      General: He is not in acute distress.    Appearance: He is well-developed.  HENT:     Head: Normocephalic and atraumatic.     Mouth/Throat:     Mouth: Mucous membranes are moist.     Pharynx: Oropharynx is clear.  Eyes:     Conjunctiva/sclera: Conjunctivae normal.  Cardiovascular:     Rate and Rhythm: Normal rate and regular rhythm.     Pulses:          Dorsalis pedis pulses are 2+ on the right side and 2+ on the left side.     Heart sounds: No murmur  heard. Pulmonary:     Effort: Pulmonary effort is normal. No respiratory distress.     Breath sounds: Normal breath sounds.  Abdominal:     Palpations: Abdomen is soft. There is no hepatomegaly or mass.     Tenderness: There is no abdominal tenderness.  Musculoskeletal:     Left wrist: Decreased range of motion (with splint on.).  Lymphadenopathy:     Cervical: No cervical adenopathy.  Skin:    General: Skin is warm.     Findings: No erythema or rash.  Neurological:     Mental Status: He is alert and oriented to person, place, and time.     Cranial Nerves: No cranial nerve deficit.  Gait: Gait normal.  Psychiatric:        Mood and Affect: Mood and affect normal.   ASSESSMENT AND PLAN:  Davier was seen today for medical management of chronic issues.  Diagnoses and all orders for this visit: Lab Results  Component Value Date   HGBA1C 7.8 (A) 07/07/2023   Lab Results  Component Value Date   NA 139 07/07/2023   CL 102 07/07/2023   K 4.6 07/07/2023   CO2 29 07/07/2023   BUN 12 07/07/2023   CREATININE 0.93 07/07/2023   GFR 97.48 07/07/2023   CALCIUM 9.7 07/07/2023   ALBUMIN 4.5 07/07/2023   GLUCOSE 142 (H) 07/07/2023   Lab Results  Component Value Date   CRP <1.0 07/07/2023   Type II diabetes mellitus with neurological manifestations ([facility]) Assessment & Plan: HgA1C went up from 6.9 to 7.8, resumed Trulicity 1.5 mg 3-4 weeks ago and reporting improvement of BS's. Continue Trulicity 1.5 mg weekly. Annual eye exam, periodic dental and foot care recommended. F/U in 3 months.  Orders: -     POCT glycosylated hemoglobin (Hb A1C) -     Basic metabolic panel -     Lipid panel; Future  Nausea and vomiting in adult Resolved. Possible etiologies discussed.? Functional dyspepsia. Could be also related to IBS.  -     Hepatic function panel; Future -     CBC; Future -     C-reactive protein; Future  Hypertension, essential, benign Assessment & Plan: Goal BP <  130/80. For now continue non pharmacologic treatment. Recommend monitoring BP at home. Eye exam is current.  Orders: -     Basic metabolic panel -     Hepatic function panel; Future  Irritable bowel syndrome with both constipation and diarrhea Assessment & Plan: GI work up negative. We discussed treatment options, he would like to try Nortriptyline 10 mg at bedtime. Side effects discussed. F/U in 3 months.  Orders: -     Nortriptyline HCl; Take 1 capsule (10 mg total) by mouth at bedtime.  Dispense: 30 capsule; Refill: 1 -     C-reactive protein; Future  Return in about 3 months (around 10/17/2023) for chronic problems.  Marquie Aderhold G. [place], MD  [facility]. [place] office.

## 2023-07-07 ENCOUNTER — Encounter: Payer: Self-pay | Admitting: Family Medicine

## 2023-07-07 ENCOUNTER — Ambulatory Visit (INDEPENDENT_AMBULATORY_CARE_PROVIDER_SITE_OTHER): Payer: BC Managed Care – PPO | Admitting: Family Medicine

## 2023-07-07 VITALS — BP 120/80 | HR 100 | Temp 99.1°F | Resp 12 | Ht 67.0 in | Wt 199.2 lb

## 2023-07-07 DIAGNOSIS — Z7985 Long-term (current) use of injectable non-insulin antidiabetic drugs: Secondary | ICD-10-CM

## 2023-07-07 DIAGNOSIS — R112 Nausea with vomiting, unspecified: Secondary | ICD-10-CM | POA: Diagnosis not present

## 2023-07-07 DIAGNOSIS — K582 Mixed irritable bowel syndrome: Secondary | ICD-10-CM

## 2023-07-07 DIAGNOSIS — E1149 Type 2 diabetes mellitus with other diabetic neurological complication: Secondary | ICD-10-CM

## 2023-07-07 DIAGNOSIS — I10 Essential (primary) hypertension: Secondary | ICD-10-CM

## 2023-07-07 LAB — CBC
HCT: 42.6 % (ref 39.0–52.0)
Hemoglobin: 13.7 g/dL (ref 13.0–17.0)
MCHC: 32.2 g/dL (ref 30.0–36.0)
MCV: 81.2 fl (ref 78.0–100.0)
Platelets: 224 10*3/uL (ref 150.0–400.0)
RBC: 5.24 Mil/uL (ref 4.22–5.81)
RDW: 14.4 % (ref 11.5–15.5)
WBC: 5.6 10*3/uL (ref 4.0–10.5)

## 2023-07-07 LAB — HEPATIC FUNCTION PANEL
ALT: 34 U/L (ref 0–53)
AST: 22 U/L (ref 0–37)
Albumin: 4.5 g/dL (ref 3.5–5.2)
Alkaline Phosphatase: 61 U/L (ref 39–117)
Bilirubin, Direct: 0.2 mg/dL (ref 0.0–0.3)
Total Bilirubin: 1.2 mg/dL (ref 0.2–1.2)
Total Protein: 7.5 g/dL (ref 6.0–8.3)

## 2023-07-07 LAB — BASIC METABOLIC PANEL
BUN: 12 mg/dL (ref 6–23)
CO2: 29 mEq/L (ref 19–32)
Calcium: 9.7 mg/dL (ref 8.4–10.5)
Chloride: 102 mEq/L (ref 96–112)
Creatinine, Ser: 0.93 mg/dL (ref 0.40–1.50)
GFR: 97.48 mL/min (ref >60.00)
Glucose, Bld: 142 mg/dL — ABNORMAL HIGH (ref 70–99)
Potassium: 4.6 mEq/L (ref 3.5–5.1)
Sodium: 139 mEq/L (ref 135–145)

## 2023-07-07 LAB — POCT GLYCOSYLATED HEMOGLOBIN (HGB A1C): Hemoglobin A1C: 7.8 % — AB (ref 4.0–5.6)

## 2023-07-07 LAB — LIPID PANEL
Cholesterol: 138 mg/dL (ref 0–200)
HDL: 44.8 mg/dL (ref >39.00)
LDL Cholesterol: 74 mg/dL (ref 0–99)
NonHDL: 93.15
Total CHOL/HDL Ratio: 3
Triglycerides: 98 mg/dL (ref 0.0–149.0)
VLDL: 19.6 mg/dL (ref 0.0–40.0)

## 2023-07-07 LAB — C-REACTIVE PROTEIN: CRP: <1 mg/dL (ref 0.5–20.0)

## 2023-07-07 MED ORDER — NORTRIPTYLINE HCL 10 MG PO CAPS: 10.0000 mg | ORAL_CAPSULE | Freq: Every day | ORAL | 1 refills | Status: AC

## 2023-07-07 NOTE — Patient Instructions (Addendum)
A few things to remember from today's visit:  Type II diabetes mellitus with neurological manifestations ([facility]) - Plan: POC HgB A1c  Hypertension, essential, benign  Nausea and vomiting in adult  Irritable bowel syndrome with both constipation and diarrhea  Try Nortriptyline 10 mg at bedtime for stomach issues. Rest no changes.  If you need refills for medications you take chronically, please call your pharmacy. Do not use My Chart to request refills or for acute issues that need immediate attention. If you send a my chart message, it may take a few days to be addressed, specially if I am not in the office.  Please be sure medication list is accurate. If a new problem present, please set up appointment sooner than planned today.

## 2023-07-07 NOTE — Assessment & Plan Note (Signed)
Recommend monitoring BP at home.

## 2023-07-08 DIAGNOSIS — M25632 Stiffness of left wrist, not elsewhere classified: Secondary | ICD-10-CM | POA: Diagnosis not present

## 2023-07-08 DIAGNOSIS — Z789 Other specified health status: Secondary | ICD-10-CM | POA: Diagnosis not present

## 2023-07-08 DIAGNOSIS — M25532 Pain in left wrist: Secondary | ICD-10-CM | POA: Diagnosis not present

## 2023-07-08 DIAGNOSIS — S638X2D Sprain of other part of left wrist and hand, subsequent encounter: Secondary | ICD-10-CM | POA: Diagnosis not present

## 2023-07-10 MED ORDER — TRULICITY 1.5 MG/0.5ML ~~LOC~~ SOAJ
1.5000 mg | SUBCUTANEOUS | 2 refills | Status: DC
Start: 2023-07-10 — End: 2024-03-15

## 2023-07-10 NOTE — Assessment & Plan Note (Signed)
GI work up negative. We discussed treatment options, he would like to try Nortriptyline 10 mg at bedtime. Side effects discussed. F/U in 3 months.

## 2023-07-10 NOTE — Assessment & Plan Note (Signed)
HgA1C went up from 6.9 to 7.8, resumed Trulicity 1.5 mg 3-4 weeks ago and reporting improvement of BS's. Continue Trulicity 1.5 mg weekly. Annual eye exam, periodic dental and foot care recommended. F/U in 3 months.

## 2023-07-11 DIAGNOSIS — S638X2D Sprain of other part of left wrist and hand, subsequent encounter: Secondary | ICD-10-CM | POA: Diagnosis not present

## 2023-07-11 DIAGNOSIS — Z789 Other specified health status: Secondary | ICD-10-CM | POA: Diagnosis not present

## 2023-07-11 DIAGNOSIS — M25632 Stiffness of left wrist, not elsewhere classified: Secondary | ICD-10-CM | POA: Diagnosis not present

## 2023-07-11 DIAGNOSIS — M25532 Pain in left wrist: Secondary | ICD-10-CM | POA: Diagnosis not present

## 2023-07-14 DIAGNOSIS — Z789 Other specified health status: Secondary | ICD-10-CM | POA: Diagnosis not present

## 2023-07-14 DIAGNOSIS — S638X2D Sprain of other part of left wrist and hand, subsequent encounter: Secondary | ICD-10-CM | POA: Diagnosis not present

## 2023-07-14 DIAGNOSIS — M25532 Pain in left wrist: Secondary | ICD-10-CM | POA: Diagnosis not present

## 2023-07-14 DIAGNOSIS — M25632 Stiffness of left wrist, not elsewhere classified: Secondary | ICD-10-CM | POA: Diagnosis not present

## 2023-07-18 DIAGNOSIS — M25532 Pain in left wrist: Secondary | ICD-10-CM | POA: Diagnosis not present

## 2023-07-18 DIAGNOSIS — M25632 Stiffness of left wrist, not elsewhere classified: Secondary | ICD-10-CM | POA: Diagnosis not present

## 2023-07-18 DIAGNOSIS — S638X2D Sprain of other part of left wrist and hand, subsequent encounter: Secondary | ICD-10-CM | POA: Diagnosis not present

## 2023-07-18 DIAGNOSIS — Z789 Other specified health status: Secondary | ICD-10-CM | POA: Diagnosis not present

## 2023-07-22 DIAGNOSIS — M25632 Stiffness of left wrist, not elsewhere classified: Secondary | ICD-10-CM | POA: Diagnosis not present

## 2023-07-22 DIAGNOSIS — M25532 Pain in left wrist: Secondary | ICD-10-CM | POA: Diagnosis not present

## 2023-07-22 DIAGNOSIS — Z789 Other specified health status: Secondary | ICD-10-CM | POA: Diagnosis not present

## 2023-07-22 DIAGNOSIS — S638X2D Sprain of other part of left wrist and hand, subsequent encounter: Secondary | ICD-10-CM | POA: Diagnosis not present

## 2023-07-24 DIAGNOSIS — Z789 Other specified health status: Secondary | ICD-10-CM | POA: Diagnosis not present

## 2023-07-24 DIAGNOSIS — M25632 Stiffness of left wrist, not elsewhere classified: Secondary | ICD-10-CM | POA: Diagnosis not present

## 2023-07-24 DIAGNOSIS — M25532 Pain in left wrist: Secondary | ICD-10-CM | POA: Diagnosis not present

## 2023-07-24 DIAGNOSIS — S638X2D Sprain of other part of left wrist and hand, subsequent encounter: Secondary | ICD-10-CM | POA: Diagnosis not present

## 2023-07-29 DIAGNOSIS — Z789 Other specified health status: Secondary | ICD-10-CM | POA: Diagnosis not present

## 2023-07-29 DIAGNOSIS — M25632 Stiffness of left wrist, not elsewhere classified: Secondary | ICD-10-CM | POA: Diagnosis not present

## 2023-07-29 DIAGNOSIS — M25532 Pain in left wrist: Secondary | ICD-10-CM | POA: Diagnosis not present

## 2023-07-29 DIAGNOSIS — S638X2D Sprain of other part of left wrist and hand, subsequent encounter: Secondary | ICD-10-CM | POA: Diagnosis not present

## 2023-07-30 ENCOUNTER — Telehealth: Payer: Self-pay | Admitting: Physician Assistant

## 2023-07-30 NOTE — Telephone Encounter (Signed)
Discussed with pts wife that we have not seen her husband in quite a while and he was not seen for nausea and vomiting. Suggested they contact their PCP or Urgent care for some medication for nausea and to try calling back to see if we have any cancellations.

## 2023-07-30 NOTE — Telephone Encounter (Signed)
Inbound call from patients wife stating that patient has been having issues with nausea and vomiting after eating. She stated that shortly after he eats he starts to feel nauseous and then a hour or so later he vomits. Patient was scheduled to see Selena Lesser on 11/4 at 9:00 and is requesting a call to discuss any recommendations in the meantime. Please advise.

## 2023-07-31 DIAGNOSIS — M25632 Stiffness of left wrist, not elsewhere classified: Secondary | ICD-10-CM | POA: Diagnosis not present

## 2023-07-31 DIAGNOSIS — Z789 Other specified health status: Secondary | ICD-10-CM | POA: Diagnosis not present

## 2023-07-31 DIAGNOSIS — S638X2D Sprain of other part of left wrist and hand, subsequent encounter: Secondary | ICD-10-CM | POA: Diagnosis not present

## 2023-07-31 DIAGNOSIS — M25532 Pain in left wrist: Secondary | ICD-10-CM | POA: Diagnosis not present

## 2023-08-05 DIAGNOSIS — M25632 Stiffness of left wrist, not elsewhere classified: Secondary | ICD-10-CM | POA: Diagnosis not present

## 2023-08-05 DIAGNOSIS — Z789 Other specified health status: Secondary | ICD-10-CM | POA: Diagnosis not present

## 2023-08-05 DIAGNOSIS — S638X2D Sprain of other part of left wrist and hand, subsequent encounter: Secondary | ICD-10-CM | POA: Diagnosis not present

## 2023-08-05 DIAGNOSIS — M25532 Pain in left wrist: Secondary | ICD-10-CM | POA: Diagnosis not present

## 2023-08-07 DIAGNOSIS — S638X2D Sprain of other part of left wrist and hand, subsequent encounter: Secondary | ICD-10-CM | POA: Diagnosis not present

## 2023-08-07 DIAGNOSIS — Z789 Other specified health status: Secondary | ICD-10-CM | POA: Diagnosis not present

## 2023-08-07 DIAGNOSIS — M25632 Stiffness of left wrist, not elsewhere classified: Secondary | ICD-10-CM | POA: Diagnosis not present

## 2023-08-07 DIAGNOSIS — M25532 Pain in left wrist: Secondary | ICD-10-CM | POA: Diagnosis not present

## 2023-08-20 DIAGNOSIS — Z789 Other specified health status: Secondary | ICD-10-CM | POA: Diagnosis not present

## 2023-08-20 DIAGNOSIS — M25632 Stiffness of left wrist, not elsewhere classified: Secondary | ICD-10-CM | POA: Diagnosis not present

## 2023-08-20 DIAGNOSIS — S638X2D Sprain of other part of left wrist and hand, subsequent encounter: Secondary | ICD-10-CM | POA: Diagnosis not present

## 2023-08-20 DIAGNOSIS — M25532 Pain in left wrist: Secondary | ICD-10-CM | POA: Diagnosis not present

## 2023-08-22 DIAGNOSIS — Z789 Other specified health status: Secondary | ICD-10-CM | POA: Diagnosis not present

## 2023-08-22 DIAGNOSIS — M25532 Pain in left wrist: Secondary | ICD-10-CM | POA: Diagnosis not present

## 2023-08-22 DIAGNOSIS — M25632 Stiffness of left wrist, not elsewhere classified: Secondary | ICD-10-CM | POA: Diagnosis not present

## 2023-08-22 DIAGNOSIS — S638X2D Sprain of other part of left wrist and hand, subsequent encounter: Secondary | ICD-10-CM | POA: Diagnosis not present

## 2023-08-24 ENCOUNTER — Other Ambulatory Visit: Payer: Self-pay | Admitting: Family Medicine

## 2023-08-24 ENCOUNTER — Encounter: Payer: Self-pay | Admitting: Family Medicine

## 2023-08-24 DIAGNOSIS — E1149 Type 2 diabetes mellitus with other diabetic neurological complication: Secondary | ICD-10-CM

## 2023-08-24 DIAGNOSIS — K219 Gastro-esophageal reflux disease without esophagitis: Secondary | ICD-10-CM

## 2023-08-25 NOTE — Telephone Encounter (Signed)
Last seen 7/29

## 2023-08-27 DIAGNOSIS — M25632 Stiffness of left wrist, not elsewhere classified: Secondary | ICD-10-CM | POA: Diagnosis not present

## 2023-08-27 DIAGNOSIS — M25532 Pain in left wrist: Secondary | ICD-10-CM | POA: Diagnosis not present

## 2023-08-27 DIAGNOSIS — S638X2D Sprain of other part of left wrist and hand, subsequent encounter: Secondary | ICD-10-CM | POA: Diagnosis not present

## 2023-08-27 DIAGNOSIS — Z789 Other specified health status: Secondary | ICD-10-CM | POA: Diagnosis not present

## 2023-08-27 MED ORDER — EMPAGLIFLOZIN 25 MG PO TABS: ORAL_TABLET | ORAL | 3 refills | Status: DC

## 2023-08-29 DIAGNOSIS — M25532 Pain in left wrist: Secondary | ICD-10-CM | POA: Diagnosis not present

## 2023-08-29 DIAGNOSIS — Z789 Other specified health status: Secondary | ICD-10-CM | POA: Diagnosis not present

## 2023-08-29 DIAGNOSIS — S638X2D Sprain of other part of left wrist and hand, subsequent encounter: Secondary | ICD-10-CM | POA: Diagnosis not present

## 2023-08-29 DIAGNOSIS — M25632 Stiffness of left wrist, not elsewhere classified: Secondary | ICD-10-CM | POA: Diagnosis not present

## 2023-09-03 DIAGNOSIS — S638X2D Sprain of other part of left wrist and hand, subsequent encounter: Secondary | ICD-10-CM | POA: Diagnosis not present

## 2023-09-03 DIAGNOSIS — M25532 Pain in left wrist: Secondary | ICD-10-CM | POA: Diagnosis not present

## 2023-09-03 DIAGNOSIS — M25632 Stiffness of left wrist, not elsewhere classified: Secondary | ICD-10-CM | POA: Diagnosis not present

## 2023-09-03 DIAGNOSIS — Z789 Other specified health status: Secondary | ICD-10-CM | POA: Diagnosis not present

## 2023-09-05 DIAGNOSIS — S638X2D Sprain of other part of left wrist and hand, subsequent encounter: Secondary | ICD-10-CM | POA: Diagnosis not present

## 2023-09-05 DIAGNOSIS — M25632 Stiffness of left wrist, not elsewhere classified: Secondary | ICD-10-CM | POA: Diagnosis not present

## 2023-09-05 DIAGNOSIS — M25532 Pain in left wrist: Secondary | ICD-10-CM | POA: Diagnosis not present

## 2023-09-05 DIAGNOSIS — Z789 Other specified health status: Secondary | ICD-10-CM | POA: Diagnosis not present

## 2023-09-15 DIAGNOSIS — S638X2D Sprain of other part of left wrist and hand, subsequent encounter: Secondary | ICD-10-CM | POA: Diagnosis not present

## 2023-09-15 DIAGNOSIS — Z789 Other specified health status: Secondary | ICD-10-CM | POA: Diagnosis not present

## 2023-09-15 DIAGNOSIS — M25532 Pain in left wrist: Secondary | ICD-10-CM | POA: Diagnosis not present

## 2023-09-15 DIAGNOSIS — M25632 Stiffness of left wrist, not elsewhere classified: Secondary | ICD-10-CM | POA: Diagnosis not present

## 2023-09-17 DIAGNOSIS — M25632 Stiffness of left wrist, not elsewhere classified: Secondary | ICD-10-CM | POA: Diagnosis not present

## 2023-09-17 DIAGNOSIS — Z789 Other specified health status: Secondary | ICD-10-CM | POA: Diagnosis not present

## 2023-09-17 DIAGNOSIS — M25532 Pain in left wrist: Secondary | ICD-10-CM | POA: Diagnosis not present

## 2023-09-17 DIAGNOSIS — S638X2D Sprain of other part of left wrist and hand, subsequent encounter: Secondary | ICD-10-CM | POA: Diagnosis not present

## 2023-09-22 DIAGNOSIS — M25632 Stiffness of left wrist, not elsewhere classified: Secondary | ICD-10-CM | POA: Diagnosis not present

## 2023-09-22 DIAGNOSIS — Z789 Other specified health status: Secondary | ICD-10-CM | POA: Diagnosis not present

## 2023-09-22 DIAGNOSIS — M25532 Pain in left wrist: Secondary | ICD-10-CM | POA: Diagnosis not present

## 2023-09-22 DIAGNOSIS — S638X2D Sprain of other part of left wrist and hand, subsequent encounter: Secondary | ICD-10-CM | POA: Diagnosis not present

## 2023-09-22 MED ORDER — OMEPRAZOLE 40 MG PO CPDR
40.0000 mg | DELAYED_RELEASE_CAPSULE | Freq: Every day | ORAL | 1 refills | Status: DC
Start: 2023-09-22 — End: 2023-12-01

## 2023-09-22 NOTE — Addendum Note (Signed)
Addended by: Kathreen Devoid on: 09/22/2023 02:42 PM   Modules accepted: Orders

## 2023-09-29 DIAGNOSIS — M25632 Stiffness of left wrist, not elsewhere classified: Secondary | ICD-10-CM | POA: Diagnosis not present

## 2023-09-29 DIAGNOSIS — M25532 Pain in left wrist: Secondary | ICD-10-CM | POA: Diagnosis not present

## 2023-09-29 DIAGNOSIS — S638X2D Sprain of other part of left wrist and hand, subsequent encounter: Secondary | ICD-10-CM | POA: Diagnosis not present

## 2023-09-29 DIAGNOSIS — Z789 Other specified health status: Secondary | ICD-10-CM | POA: Diagnosis not present

## 2023-10-06 ENCOUNTER — Ambulatory Visit: Payer: BC Managed Care – PPO | Admitting: Family Medicine

## 2023-10-06 DIAGNOSIS — M25632 Stiffness of left wrist, not elsewhere classified: Secondary | ICD-10-CM | POA: Diagnosis not present

## 2023-10-06 DIAGNOSIS — M25532 Pain in left wrist: Secondary | ICD-10-CM | POA: Diagnosis not present

## 2023-10-06 DIAGNOSIS — S638X2D Sprain of other part of left wrist and hand, subsequent encounter: Secondary | ICD-10-CM | POA: Diagnosis not present

## 2023-10-06 DIAGNOSIS — Z789 Other specified health status: Secondary | ICD-10-CM | POA: Diagnosis not present

## 2023-10-09 NOTE — Progress Notes (Signed)
10/13/2023 Mitchell Oneill [id number] [birth year]  Primary GI Dr. Leonides Schanz ( Dr. Orvan Falconer)   ASSESSMENT AND PLAN:   Gastrointestinal Symptoms 02/16/2019 Colon/EGD colon with tics, negative for microscopic colitis EGD normal negative for celiac.  Chronic alternating diarrhea and constipation, with recent onset of nausea, vomiting, and sulfuric belching in setting of diclofenac. Epigastric pain for the past year. Previous imaging showed significant stool in colon, some concern for overflow diarrhea versus EPI. Possible gastroparesis due to diabetes. -Schedule an endoscopy at [facility] to evaluate for possible ulcers or other abnormalities with patient's symptoms and diclofenac use over the past 6 months due to wrist surgery.  discussed risks of EGD with patient today, including risk of sedation, bleeding or perforation.  Patient provides understanding and gave verbal consent to proceed. -Order a right upper quadrant ultrasound to assess gallbladder, check labs. -Start trial of Creon with meals to aid digestion and possibly alleviate symptoms, some worsening diarrhea after food. -Provide information on gastroparesis diet, consider Reglan -Repeat abdominal x-ray to assess stool burden. -Consider future trial of Linzess if symptoms persist after trying Creon.  Type 2 Diabetes Long-standing history, currently managed with Trulicity and Jardiance. Recent blood sugars have been spiking to 230-240. -Continue current medications. -Encourage reduction in consumption of sugary drinks, including regular sodas and juices, to improve blood sugar control.  Medication Management Taking diclofenac for wrist pain, which can cause ulcers and renal issues. -Advise to take diclofenac with food to protect stomach but to limit use. -Continue omeprazole to protect stomach lining.  Diverticulosis of colon without hemorrhage Will call if any symptoms. Add on fiber supplement, avoid NSAIDS, information  given  Follow-up Plan for follow-up after endoscopy and ultrasound results are available.  -Recall colon 2030  History of Present Illness:  47 y.o. male  with a past medical history of DM2 x 20 years, HTN, diarrhea and others listed below, previously known to Dr. Orvan Falconer returns to clinic today for evaluation of nausea and vomiting.   02/16/2019 Colon/EGD colon with tics, negative for microscopic colitis EGD normal negative for celiac.  Negative fecal calprotectin, giardia, sed rate/CRP, TSH.  Last seen 02/11/2022 in the office with myself for diarrhea/constipation. KUB showed moderate stool, started on miralax/fiber. Normal pancreatic elastase.   Discussed the use of AI scribe software for clinical note transcription with the patient, who gave verbal consent to proceed.  Patient previously seen in the office in March complaining of diarrhea/constipation.  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  In the past six months to a year, the patient has developed additional symptoms, including vomiting and belching with a sulfur smell. He also reports abdominal discomfort localized in the epigastric region. These symptoms do not appear to be related to specific food or drink intake, as even small amounts of certain foods, such as potato chips, can trigger severe symptoms. The patient also reports feeling full after meals, even small ones, and has lost five pounds since the last visit with primary care.  The patient has been on Trulicity for diabetes management for several years, with a brief interruption due to pharmacy supply issues. He recently restarted this medication in June. He also started Jardiance due to recent spikes in blood sugar levels. The patient admits to  consuming regular sodas and juices, which may be  contributing to his diabetes management issues.  The patient also reports taking diclofenac for wrist pain following a recent surgery. He has been on this medication more consistently for the past six months. He is also on omeprazole, but does not report any symptoms of acid reflux.   The patient's bowel habits continue to fluctuate, with some days without a bowel movement. 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 The patient admits to fasting during the day to avoid potential issues. He has been taking Imodium and has discontinued Pepto-Bismol. The patient denies any blood in the stool or black stool.   Current Medications:   Current Outpatient Medications (Endocrine & Metabolic):    Dulaglutide (TRULICITY) 1.5 MG/0.5ML SOPN, Inject 1.5 mg into the skin once a week.   empagliflozin (JARDIANCE) 25 MG TABS tablet, Take 1/2 tablet by mouth daily for 2 weeks, and then increase to a full tablet daily. (Patient taking differently: 25 mg daily. Take 1/2 tablet by mouth daily for 2 weeks, and then increase to a full tablet daily.)  Current Outpatient Medications (Cardiovascular):    pravastatin (PRAVACHOL) 10 MG tablet, TAKE 1 TABLET DAILY  Current Outpatient Medications (Respiratory):    fexofenadine (ALLEGRA) 180 MG tablet, Take 180 mg by mouth daily.  Current Outpatient Medications (Analgesics):    diclofenac (VOLTAREN) 75 MG EC tablet, Take 75 mg by mouth 2 (two) times daily.   Current Outpatient Medications (Other):    Blood Glucose Monitoring Suppl (ONE TOUCH ULTRA 2) w/Device KIT, 1 Device by Does not apply route 2 (two) times daily.   Continuous Blood Gluc Sensor (DEXCOM G6 SENSOR) MISC, Change sensor q 10 days.   Continuous Blood Gluc Transmit (DEXCOM G6 TRANSMITTER) MISC, 1 Device by Does not apply route every 3 (three) months.   glucose blood test strip, Use as  instructed   Lancets (ONETOUCH ULTRASOFT) lancets, Use as instructed   nortriptyline (PAMELOR) 10 MG capsule, Take 1 capsule (10 mg total) by mouth at bedtime.   omeprazole (PRILOSEC) 40 MG capsule, Take 1 capsule (40 mg total) by mouth daily.   tiZANidine (ZANAFLEX) 4 MG tablet, Take 2 mg by mouth 3 (three) times daily as needed.  Surgical History:  He  has a past surgical history that includes Wrist surgery (Left, 04/09/2023) and Wisdom tooth extraction. Family History:  His family history includes Arthritis in his father and mother; Diabetes in his father, maternal grandfather, paternal grandfather, and sister; Hyperlipidemia in his father and mother. Social History:   reports that he has been smoking e-cigarettes. He has never used smokeless tobacco. He reports that he does not currently use alcohol. He reports that he does not use drugs.  Current Medications, Allergies, Past Medical History, Past Surgical History, Family History and Social History were reviewed in [employer] record.  Physical Exam: BP 120/80 (BP Location: Left Arm, Patient Position: Sitting, Cuff Size: Normal)   Pulse 84   Ht 5' 6.5" (1.689 m) Comment: height measured without shoes  Wt 195 lb 6 oz (88.6 kg)   BMI 31.06 kg/m  General:   Pleasant, well developed male in no acute distress Eyes: sclerae anicteric,conjunctive pink  Heart:  regular rate and rhythm Pulm: Clear anteriorly; no wheezing Abdomen:  Soft, Obese AB, skin exam normal, Normal bowel sounds. mild tenderness in the lower abdomen. Without guarding and Without rebound, without hepatomegaly. Extremities:  Without edema. Peripheral pulses intact.  Neurologic:  Alert  and  oriented x4;  grossly normal neurologically. Skin:   Dry and intact without significant lesions or rashes. Psychiatric: Demonstrates good judgement and reason without abnormal affect or behaviors.  Doree Albee, PA-C 10/13/23

## 2023-10-13 ENCOUNTER — Other Ambulatory Visit (INDEPENDENT_AMBULATORY_CARE_PROVIDER_SITE_OTHER): Payer: BC Managed Care – PPO

## 2023-10-13 ENCOUNTER — Encounter: Payer: Self-pay | Admitting: Physician Assistant

## 2023-10-13 ENCOUNTER — Ambulatory Visit (INDEPENDENT_AMBULATORY_CARE_PROVIDER_SITE_OTHER): Payer: BC Managed Care – PPO | Admitting: Physician Assistant

## 2023-10-13 VITALS — BP 120/80 | HR 84 | Ht 66.5 in | Wt 195.4 lb

## 2023-10-13 DIAGNOSIS — R112 Nausea with vomiting, unspecified: Secondary | ICD-10-CM

## 2023-10-13 DIAGNOSIS — Z789 Other specified health status: Secondary | ICD-10-CM | POA: Diagnosis not present

## 2023-10-13 DIAGNOSIS — K219 Gastro-esophageal reflux disease without esophagitis: Secondary | ICD-10-CM

## 2023-10-13 DIAGNOSIS — K582 Mixed irritable bowel syndrome: Secondary | ICD-10-CM | POA: Diagnosis not present

## 2023-10-13 DIAGNOSIS — R198 Other specified symptoms and signs involving the digestive system and abdomen: Secondary | ICD-10-CM

## 2023-10-13 DIAGNOSIS — M25632 Stiffness of left wrist, not elsewhere classified: Secondary | ICD-10-CM | POA: Diagnosis not present

## 2023-10-13 DIAGNOSIS — E1149 Type 2 diabetes mellitus with other diabetic neurological complication: Secondary | ICD-10-CM

## 2023-10-13 DIAGNOSIS — K5730 Diverticulosis of large intestine without perforation or abscess without bleeding: Secondary | ICD-10-CM

## 2023-10-13 DIAGNOSIS — M25532 Pain in left wrist: Secondary | ICD-10-CM | POA: Diagnosis not present

## 2023-10-13 DIAGNOSIS — S638X2D Sprain of other part of left wrist and hand, subsequent encounter: Secondary | ICD-10-CM | POA: Diagnosis not present

## 2023-10-13 LAB — COMPREHENSIVE METABOLIC PANEL
ALT: 34 U/L (ref 0–53)
AST: 21 U/L (ref 0–37)
Albumin: 4.5 g/dL (ref 3.5–5.2)
Alkaline Phosphatase: 58 U/L (ref 39–117)
BUN: 10 mg/dL (ref 6–23)
CO2: 29 meq/L (ref 19–32)
Calcium: 9.8 mg/dL (ref 8.4–10.5)
Chloride: 106 meq/L (ref 96–112)
Creatinine, Ser: 0.86 mg/dL (ref 0.40–1.50)
GFR: 102.61 mL/min (ref >60.00)
Glucose, Bld: 131 mg/dL — ABNORMAL HIGH (ref 70–99)
Potassium: 4.9 meq/L (ref 3.5–5.1)
Sodium: 144 meq/L (ref 135–145)
Total Bilirubin: 0.6 mg/dL (ref 0.2–1.2)
Total Protein: 7.6 g/dL (ref 6.0–8.3)

## 2023-10-13 LAB — CBC WITH DIFFERENTIAL/PLATELET
Basophils Absolute: 0 10*3/uL (ref 0.0–0.1)
Basophils Relative: 1.1 % (ref 0.0–3.0)
Eosinophils Absolute: 0.5 10*3/uL (ref 0.0–0.7)
Eosinophils Relative: 11.8 % — ABNORMAL HIGH (ref 0.0–5.0)
HCT: 46.1 % (ref 39.0–52.0)
Hemoglobin: 14.6 g/dL (ref 13.0–17.0)
Lymphocytes Relative: 28.1 % (ref 12.0–46.0)
Lymphs Abs: 1.2 10*3/uL (ref 0.7–4.0)
MCHC: 31.7 g/dL (ref 30.0–36.0)
MCV: 83.1 fL (ref 78.0–100.0)
Monocytes Absolute: 0.4 10*3/uL (ref 0.1–1.0)
Monocytes Relative: 9 % (ref 3.0–12.0)
Neutro Abs: 2.2 10*3/uL (ref 1.4–7.7)
Neutrophils Relative %: 50 % (ref 43.0–77.0)
Platelets: 202 10*3/uL (ref 150.0–400.0)
RBC: 5.54 Mil/uL (ref 4.22–5.81)
RDW: 15.5 % (ref 11.5–15.5)
WBC: 4.4 10*3/uL (ref 4.0–10.5)

## 2023-10-13 LAB — TSH: TSH: 1.07 u[IU]/mL (ref 0.35–5.50)

## 2023-10-13 LAB — LIPASE: Lipase: 32 U/L (ref 11.0–59.0)

## 2023-10-13 LAB — SEDIMENTATION RATE: Sed Rate: 19 mm/h — ABNORMAL HIGH (ref 0–15)

## 2023-10-13 NOTE — Patient Instructions (Signed)
Your provider has requested that you go to the basement level for lab work before leaving today. Press "B" on the elevator. The lab is located at the first door on the left as you exit the elevator.  You have been scheduled for an abdominal ultrasound at [facility] (1st floor of hospital) on 10/20/23 at 8:30 am. Please arrive 30 minutes prior to your appointment for registration. Make certain not to have anything to eat or drink after midnight. Should you need to reschedule your appointment, please contact radiology at [phone]. This test typically takes about 30 minutes to perform.   You have been scheduled for an endoscopy. Please follow written instructions given to you at your visit today.  If you use inhalers (even only as needed), please bring them with you on the day of your procedure.  If you take any of the following medications, they will need to be adjusted prior to your procedure:   DO NOT TAKE 7 DAYS PRIOR TO TEST- Trulicity (dulaglutide) Ozempic, Wegovy (semaglutide) Mounjaro (tirzepatide) Bydureon Bcise (exanatide extended release)  DO NOT TAKE 1 DAY PRIOR TO YOUR TEST Rybelsus (semaglutide) Adlyxin (lixisenatide) Victoza (liraglutide) Byetta (exanatide) ___________________________________________________________________________     Gastroparesis Please do small frequent meals like 4-6 meals a day.  Eat and drink liquids at separate times.  Avoid high fiber foods, cook your vegetables, avoid high fat food.  Suggest spreading protein throughout the day (greek yogurt, glucerna, soft meat, milk, eggs) Choose soft foods that you can mash with a fork When you are more symptomatic, change to pureed foods foods and liquids.  Consider reading "Living well with Gastroparesis" by Reuel Derby Gastroparesis is a condition in which food takes longer than normal to empty from the stomach. This condition is also known as delayed gastric emptying. It is usually  a long-term (chronic) condition. There is no cure, but there are treatments and things that you can do at home to help relieve symptoms. Treating the underlying condition that causes gastroparesis can also help relieve symptoms What are the causes? In many cases, the cause of this condition is not known. Possible causes include: A hormone (endocrine) disorder, such as hypothyroidism or diabetes. A nervous system disease, such as Parkinson's disease or multiple sclerosis. Cancer, infection, or surgery that affects the stomach or vagus nerve. The vagus nerve runs from your chest, through your neck, and to the lower part of your brain. A connective tissue disorder, such as scleroderma. Certain medicines. What increases the risk? You are more likely to develop this condition if: You have certain disorders or diseases. These may include: An endocrine disorder. An eating disorder. Amyloidosis. Scleroderma. Parkinson's disease. Multiple sclerosis. Cancer or infection of the stomach or the vagus nerve. You have had surgery on your stomach or vagus nerve. You take certain medicines. You are male. What are the signs or symptoms? Symptoms of this condition include: Feeling full after eating very little or a loss of appetite. Nausea, vomiting, or heartburn. Bloating of your abdomen. Inconsistent blood sugar (glucose) levels on blood tests. Unexplained weight loss. Acid from the stomach coming up into the esophagus (gastroesophageal reflux). Sudden tightening (spasm) of the stomach, which can be painful. Symptoms may come and go. Some people may not notice any symptoms. How is this diagnosed? This condition is diagnosed with tests, such as: Tests that check how long it takes food to move through the stomach and intestines. These tests include: Upper gastrointestinal (GI) series. For this test, you drink a  liquid that shows up well on X-rays, and then X-rays are taken of your  intestines. Gastric emptying scintigraphy. For this test, you eat food that contains a small amount of radioactive material, and then scans are taken. Wireless capsule GI monitoring system. For this test, you swallow a pill (capsule) that records information about how foods and fluid move through your stomach. Gastric manometry. For this test, a tube is passed down your throat and into your stomach to measure electrical and muscular activity. Endoscopy. For this test, a long, thin tube with a camera and light on the end is passed down your throat and into your stomach to check for problems in your stomach lining. Ultrasound. This test uses sound waves to create images of the inside of your body. This can help rule out gallbladder disease or pancreatitis as a cause of your symptoms. How is this treated? There is no cure for this condition, but treatment and home care may relieve symptoms. Treatment may include: Treating the underlying cause. Managing your symptoms by making changes to your diet and exercise habits. Taking medicines to control nausea and vomiting and to stimulate stomach muscles. Getting food through a feeding tube in the hospital. This may be done in severe cases. Having surgery to insert a device called a gastric electrical stimulator into your body. This device helps improve stomach emptying and control nausea and vomiting. Follow these instructions at home: Take over-the-counter and prescription medicines only as told by your health care provider. Follow instructions from your health care provider about eating or drinking restrictions. Your health care provider may recommend that you: Eat smaller meals more often. Eat low-fat foods. Eat low-fiber forms of high-fiber foods. For example, eat cooked vegetables instead of raw vegetables. Have only liquid foods instead of solid foods. Liquid foods are easier to digest. Drink enough fluid to keep your urine pale yellow. Exercise as  often as told by your health care provider. Keep all follow-up visits. This is important. Contact a health care provider if you: Notice that your symptoms do not improve with treatment. Have new symptoms. Get help right away if you: Have severe pain in your abdomen that does not improve with treatment. Have nausea that is severe or does not go away. Vomit every time you drink fluids. Summary Gastroparesis is a long-term (chronic) condition in which food takes longer than normal to empty from the stomach. Symptoms include nausea, vomiting, heartburn, bloating of your abdomen, and loss of appetite. Eating smaller portions, low-fat foods, and low-fiber forms of high-fiber foods may help you manage your symptoms. Get help right away if you have severe pain in your abdomen. This information is not intended to replace advice given to you by your health care provider. Make sure you discuss any questions you have with your health care provider. Document Revised: 04/03/2020 Document Reviewed: 04/03/2020 Elsevier Patient Education  2021 Elsevier Inc.   Exocrine Pancreatic Insufficiency (EPI) EPI is a condition in which your body doesn't provide enough pancreatic enzymes to properly digest your food (which can sometimes lead to some unpleasant digestive symptoms such as bloating, AB pain and loose stools, weight loss).   For many people, EPI is also a chronic lifelong condition.   That's why its so important to know what to expect with your EPI treatment, because with the right plan in place, EPI is manageable.  HOW DO I TAKE PANCREATIC ENZYMES?  Pancreatic Enzyme Replacement therapy (Creon) is only available through prescription and cannot be substituted  with over the counter alternatives.   Your doctor will personalize your dose based on your weight, diet, and symptoms.  The number of capsules you take per meal will depend on your prescribed dose.  Creon must be taken DURING every meal and  snack.   Whether its a full meal or a snack, take Creon every time you eat. Remember to follow your treatment plan closely and take Creon exactly as prescribed - consistency is key!!  Dose adjustments are normal with Creon.  Your doctor will start your on the dose they deem appropriate for you.   Remember to follow up with your doctor after the first two weeks to ensure your therapy is effective and you're managing your treatment appropriately.

## 2023-10-13 NOTE — Progress Notes (Signed)
I agree with the assessment and plan as outlined by Ms. Collier. 

## 2023-10-20 ENCOUNTER — Ambulatory Visit (HOSPITAL_COMMUNITY)
Admission: RE | Admit: 2023-10-20 | Discharge: 2023-10-20 | Disposition: A | Discharge status: Home / Self Care | Payer: BC Managed Care – PPO | Source: Ambulatory Visit | Attending: Physician Assistant | Admitting: Physician Assistant

## 2023-10-20 DIAGNOSIS — K219 Gastro-esophageal reflux disease without esophagitis: Secondary | ICD-10-CM | POA: Diagnosis not present

## 2023-10-20 DIAGNOSIS — R112 Nausea with vomiting, unspecified: Secondary | ICD-10-CM | POA: Insufficient documentation

## 2023-10-20 DIAGNOSIS — Z789 Other specified health status: Secondary | ICD-10-CM | POA: Diagnosis not present

## 2023-10-20 DIAGNOSIS — S638X2D Sprain of other part of left wrist and hand, subsequent encounter: Secondary | ICD-10-CM | POA: Diagnosis not present

## 2023-10-20 DIAGNOSIS — M25632 Stiffness of left wrist, not elsewhere classified: Secondary | ICD-10-CM | POA: Diagnosis not present

## 2023-10-20 DIAGNOSIS — M25532 Pain in left wrist: Secondary | ICD-10-CM | POA: Diagnosis not present

## 2023-10-27 DIAGNOSIS — M25632 Stiffness of left wrist, not elsewhere classified: Secondary | ICD-10-CM | POA: Diagnosis not present

## 2023-10-27 DIAGNOSIS — Z789 Other specified health status: Secondary | ICD-10-CM | POA: Diagnosis not present

## 2023-10-27 DIAGNOSIS — M25532 Pain in left wrist: Secondary | ICD-10-CM | POA: Diagnosis not present

## 2023-10-27 DIAGNOSIS — S638X2D Sprain of other part of left wrist and hand, subsequent encounter: Secondary | ICD-10-CM | POA: Diagnosis not present

## 2023-10-29 ENCOUNTER — Encounter: Payer: Self-pay | Admitting: Family Medicine

## 2023-10-29 DIAGNOSIS — E162 Hypoglycemia, unspecified: Secondary | ICD-10-CM

## 2023-10-29 DIAGNOSIS — E1149 Type 2 diabetes mellitus with other diabetic neurological complication: Secondary | ICD-10-CM

## 2023-10-31 MED ORDER — DEXCOM G6 SENSOR MISC: 3 refills | Status: AC

## 2023-11-05 DIAGNOSIS — M25532 Pain in left wrist: Secondary | ICD-10-CM | POA: Diagnosis not present

## 2023-11-05 DIAGNOSIS — Z789 Other specified health status: Secondary | ICD-10-CM | POA: Diagnosis not present

## 2023-11-05 DIAGNOSIS — M25632 Stiffness of left wrist, not elsewhere classified: Secondary | ICD-10-CM | POA: Diagnosis not present

## 2023-11-05 DIAGNOSIS — S638X2D Sprain of other part of left wrist and hand, subsequent encounter: Secondary | ICD-10-CM | POA: Diagnosis not present

## 2023-11-10 DIAGNOSIS — Z789 Other specified health status: Secondary | ICD-10-CM | POA: Diagnosis not present

## 2023-11-10 DIAGNOSIS — M25532 Pain in left wrist: Secondary | ICD-10-CM | POA: Diagnosis not present

## 2023-11-10 DIAGNOSIS — M25632 Stiffness of left wrist, not elsewhere classified: Secondary | ICD-10-CM | POA: Diagnosis not present

## 2023-11-10 DIAGNOSIS — S638X2D Sprain of other part of left wrist and hand, subsequent encounter: Secondary | ICD-10-CM | POA: Diagnosis not present

## 2023-11-12 ENCOUNTER — Encounter: Payer: Self-pay | Admitting: Internal Medicine

## 2023-11-17 DIAGNOSIS — S638X2D Sprain of other part of left wrist and hand, subsequent encounter: Secondary | ICD-10-CM | POA: Diagnosis not present

## 2023-11-17 DIAGNOSIS — M25532 Pain in left wrist: Secondary | ICD-10-CM | POA: Diagnosis not present

## 2023-11-17 DIAGNOSIS — M25632 Stiffness of left wrist, not elsewhere classified: Secondary | ICD-10-CM | POA: Diagnosis not present

## 2023-11-17 DIAGNOSIS — Z789 Other specified health status: Secondary | ICD-10-CM | POA: Diagnosis not present

## 2023-11-24 ENCOUNTER — Telehealth: Payer: Self-pay

## 2023-11-24 ENCOUNTER — Encounter: Payer: BC Managed Care – PPO | Admitting: Internal Medicine

## 2023-11-24 NOTE — Telephone Encounter (Signed)
Patient's girlfriend, Dorthula Matas called stating that pt was scheduled for an EGD procedure today with Dr. Leonides Schanz but patient took Trulicity injection on Saturday.  Pt's girlfriend informed that EGD would need to be rescheduled since there is a 7 day hold required on the Trulicity injection.  Rescheduled pt for 12/01/23 @ 3:00 pm.  Reviewed prep instructions with new times and reminded that pt will need to remain off of Trulicity by skipping his weekly injection on Saturday for his scheduled procedure.  Offered to send new updated instructions with new times to pt.  Pt's girlfriend stated that would not be necessary and voiced understanding of new prep and arrival times.

## 2023-12-01 ENCOUNTER — Ambulatory Visit: Payer: BC Managed Care – PPO | Admitting: Internal Medicine

## 2023-12-01 ENCOUNTER — Encounter: Payer: Self-pay | Admitting: Internal Medicine

## 2023-12-01 VITALS — BP 104/78 | HR 69 | Temp 98.4°F | Resp 17 | Ht 66.0 in | Wt 195.0 lb

## 2023-12-01 DIAGNOSIS — K2950 Unspecified chronic gastritis without bleeding: Secondary | ICD-10-CM

## 2023-12-01 DIAGNOSIS — K219 Gastro-esophageal reflux disease without esophagitis: Secondary | ICD-10-CM

## 2023-12-01 DIAGNOSIS — R112 Nausea with vomiting, unspecified: Secondary | ICD-10-CM

## 2023-12-01 DIAGNOSIS — R1013 Epigastric pain: Secondary | ICD-10-CM

## 2023-12-01 MED ORDER — SODIUM CHLORIDE 0.9 % IV SOLN: 500.0000 mL | Freq: Once | INTRAVENOUS | Status: DC

## 2023-12-01 MED ORDER — PANCRELIPASE (LIP-PROT-AMYL) 36000-114000 UNITS PO CPEP: ORAL_CAPSULE | ORAL | 11 refills | Status: AC

## 2023-12-01 MED ORDER — OMEPRAZOLE 40 MG PO CPDR: DELAYED_RELEASE_CAPSULE | ORAL | 5 refills | Status: DC

## 2023-12-01 NOTE — Op Note (Signed)
[facility] Patient Name: Mitchell Oneill Procedure Date: 12/01/2023 2:45 PM MRN: [id number] Endoscopist: Madelyn Brunner [place] , , [phone] Age: 47 Referring MD:  Date of Birth: 02-25-1976 Gender: Male Account #: [id number] Procedure:                Upper GI endoscopy Indications:              Epigastric abdominal pain, Nausea with vomiting Medicines:                Monitored Anesthesia Care Procedure:                Pre-Anesthesia Assessment:                           - Prior to the procedure, a History and Physical                            was performed, and patient medications and                            allergies were reviewed. The patient's tolerance of                            previous anesthesia was also reviewed. The risks                            and benefits of the procedure and the sedation                            options and risks were discussed with the patient.                            All questions were answered, and informed consent                            was obtained. Prior Anticoagulants: The patient has                            taken no anticoagulant or antiplatelet agents. ASA                            Grade Assessment: II - A patient with mild systemic                            disease. After reviewing the risks and benefits,                            the patient was deemed in satisfactory condition to                            undergo the procedure.                           After obtaining informed consent, the endoscope was  passed under direct vision. Throughout the                            procedure, the patient's blood pressure, pulse, and                            oxygen saturations were monitored continuously. The                            GIF [id number] #[id number] was introduced through the                            mouth, and advanced to the second part of duodenum.                            The  upper GI endoscopy was accomplished without                            difficulty. The patient tolerated the procedure                            well. Scope In: Scope Out: Findings:                 Mild mucosal variance characterized by altered                            texture was found in the entire esophagus. Biopsies                            were taken with a cold forceps for histology.                           Localized mildly erythematous mucosa without                            bleeding was found in the gastric antrum. Biopsies                            were taken with a cold forceps for histology.                           The examined duodenum was normal. Complications:            No immediate complications. Estimated Blood Loss:     Estimated blood loss was minimal. Impression:               - Esophageal mucosal variant. Biopsied.                           - Erythematous mucosa in the antrum. Biopsied.                           - Normal examined duodenum. Recommendation:           - Discharge patient to home (with escort).                           -  Await pathology results.                           - Use Prilosec (omeprazole) 40 mg PO BID for 8                            weeks, then daily.                           - Return to GI clinic in 2-3 months.                           - The findings and recommendations were discussed                            with the patient. Dr Particia Lather "Alan Ripper" Leonides Schanz,  12/01/2023 3:00:09 PM

## 2023-12-01 NOTE — Progress Notes (Signed)
Per Dr. Leonides Schanz Order Pancreatic Elastase stool sample Creon- 2 capsules with meals, 1 capsule with snacks

## 2023-12-01 NOTE — Patient Instructions (Addendum)
Await pathology results. Use Prilosec (omeprazole) 40 mg PO tice daily (1 tablet 30 minutes before breakfast and dinner) for 8 weeks, then daily.  weeks, then daily. Return to GI clinic in 2-3 months. Pick up stool kit prior to discharge Creon- take 2 capsules with meals and 1 capsule with snacks   YOU HAD AN ENDOSCOPIC PROCEDURE TODAY AT THE [facility]:   Refer to the procedure report that was given to you for any specific questions about what was found during the examination.  If the procedure report does not answer your questions, please call your gastroenterologist to clarify.  If you requested that your care partner not be given the details of your procedure findings, then the procedure report has been included in a sealed envelope for you to review at your convenience later.  YOU SHOULD EXPECT: Some feelings of bloating in the abdomen. Passage of more gas than usual.  Walking can help get rid of the air that was put into your GI tract during the procedure and reduce the bloating. If you had a lower endoscopy (such as a colonoscopy or flexible sigmoidoscopy) you may notice spotting of blood in your stool or on the toilet paper. If you underwent a bowel prep for your procedure, you may not have a normal bowel movement for a few days.  Please Note:  You might notice some irritation and congestion in your nose or some drainage.  This is from the oxygen used during your procedure.  There is no need for concern and it should clear up in a day or so.  SYMPTOMS TO REPORT IMMEDIATELY:  Following lower endoscopy (colonoscopy or flexible sigmoidoscopy):  Excessive amounts of blood in the stool  Significant tenderness or worsening of abdominal pains  Swelling of the abdomen that is new, acute  Fever of 100F or higher For urgent or emergent issues, a gastroenterologist can be reached at any hour by calling [phone]. Do not use MyChart messaging for urgent concerns.    DIET:  We  do recommend a small meal at first, but then you may proceed to your regular diet.  Drink plenty of fluids but you should avoid alcoholic beverages for 24 hours.  ACTIVITY:  You should plan to take it easy for the rest of today and you should NOT DRIVE or use heavy machinery until tomorrow (because of the sedation medicines used during the test).    FOLLOW UP: Our staff will call the number listed on your records the next business day following your procedure.  We will call around 7:15- 8:00 am to check on you and address any questions or concerns that you may have regarding the information given to you following your procedure. If we do not reach you, we will leave a message.     If any biopsies were taken you will be contacted by phone or by letter within the next 1-3 weeks.  Please call us at [phone] if you have not heard about the biopsies in 3 weeks.    SIGNATURES/CONFIDENTIALITY: You and/or your care partner have signed paperwork which will be entered into your electronic medical record.  These signatures attest to the fact that that the information above on your After Visit Summary has been reviewed and is understood.  Full responsibility of the confidentiality of this discharge information lies with you and/or your care-partner.

## 2023-12-01 NOTE — Progress Notes (Signed)
Vss nad trans to pacu 

## 2023-12-01 NOTE — Progress Notes (Signed)
Called to room to assist during endoscopic procedure.  Patient ID and intended procedure confirmed with present staff. Received instructions for my participation in the procedure from the performing physician.  

## 2023-12-01 NOTE — Progress Notes (Signed)
GASTROENTEROLOGY PROCEDURE H&P NOTE   Primary Care Physician: [place], Betty G, MD    Reason for Procedure:   Nausea, vomiting, epigastric ab pain  Plan:    EGD  Patient is appropriate for endoscopic procedure(s) in the ambulatory ([facility]) setting.  The nature of the procedure, as well as the risks, benefits, and alternatives were carefully and thoroughly reviewed with the patient. Ample time for discussion and questions allowed. The patient understood, was satisfied, and agreed to proceed.     HPI: Mitchell Oneill is a 47 y.o. male who presents for EGD for evaluation of N&V and epigastric ab pain .  Patient was most recently seen in the Gastroenterology Clinic on 10/13/23.  No interval change in medical history since that appointment. Please refer to that note for full details regarding GI history and clinical presentation.   Past Medical History:  Diagnosis Date   Allergy    seasonal   Chicken pox    Diabetes mellitus without complication (HCC)    Hypertension    IBS (irritable bowel syndrome)     Past Surgical History:  Procedure Laterality Date   WISDOM TOOTH EXTRACTION     WRIST SURGERY Left 04/09/2023   ruptured tendon    Prior to Admission medications   Medication Sig Start Date End Date Taking? Authorizing Provider  Blood Glucose Monitoring Suppl (ONE TOUCH ULTRA 2) w/Device KIT 1 Device by Does not apply route 2 (two) times daily. 07/22/17   [place], Betty G, MD  Continuous Blood Gluc Transmit (DEXCOM G6 TRANSMITTER) MISC 1 Device by Does not apply route every 3 (three) months. 09/30/22   [place], Betty G, MD  Continuous Glucose Sensor (DEXCOM G6 SENSOR) MISC Change sensor q 10 days. 10/31/23   [place], Betty G, MD  diclofenac (VOLTAREN) 75 MG EC tablet Take 75 mg by mouth 2 (two) times daily.    [provider]  Dulaglutide (TRULICITY) 1.5 MG/0.5ML SOPN Inject 1.5 mg into the skin once a week. 07/10/23   [place], Betty G, MD  empagliflozin (JARDIANCE) 25 MG TABS  tablet Take 1/2 tablet by mouth daily for 2 weeks, and then increase to a full tablet daily. Patient taking differently: 25 mg daily. Take 1/2 tablet by mouth daily for 2 weeks, and then increase to a full tablet daily. 08/27/23   [place], Betty G, MD  fexofenadine (ALLEGRA) 180 MG tablet Take 180 mg by mouth daily.    [provider]  glucose blood test strip Use as instructed 07/22/17   [place], Betty G, MD  Lancets [facility] ULTRASOFT) lancets Use as instructed 07/22/17   [place], Betty G, MD  nortriptyline (PAMELOR) 10 MG capsule Take 1 capsule (10 mg total) by mouth at bedtime. 07/07/23   [place], Betty G, MD  omeprazole (PRILOSEC) 40 MG capsule Take 1 capsule (40 mg total) by mouth daily. 09/22/23   [place], Betty G, MD  pravastatin (PRAVACHOL) 10 MG tablet TAKE 1 TABLET DAILY 11/16/21   [place], Betty G, MD  tiZANidine (ZANAFLEX) 4 MG tablet Take 2 mg by mouth 3 (three) times daily as needed. 01/28/22   [provider]    Current Outpatient Medications  Medication Sig Dispense Refill   Blood Glucose Monitoring Suppl (ONE TOUCH ULTRA 2) w/Device KIT 1 Device by Does not apply route 2 (two) times daily. 1 each 0   Continuous Blood Gluc Transmit (DEXCOM G6 TRANSMITTER) MISC 1 Device by Does not apply route every 3 (three) months. 1 each 3   Continuous  Glucose Sensor (DEXCOM G6 SENSOR) MISC Change sensor q 10 days. 3 each 3   diclofenac (VOLTAREN) 75 MG EC tablet Take 75 mg by mouth 2 (two) times daily.     Dulaglutide (TRULICITY) 1.5 MG/0.5ML SOPN Inject 1.5 mg into the skin once a week. 6 mL 2   empagliflozin (JARDIANCE) 25 MG TABS tablet Take 1/2 tablet by mouth daily for 2 weeks, and then increase to a full tablet daily. (Patient taking differently: 25 mg daily. Take 1/2 tablet by mouth daily for 2 weeks, and then increase to a full tablet daily.) 30 tablet 3   fexofenadine (ALLEGRA) 180 MG tablet Take 180 mg by mouth daily.     glucose blood test strip Use as instructed 180 each 3    Lancets (ONETOUCH ULTRASOFT) lancets Use as instructed 180 each 3   nortriptyline (PAMELOR) 10 MG capsule Take 1 capsule (10 mg total) by mouth at bedtime. 30 capsule 1   omeprazole (PRILOSEC) 40 MG capsule Take 1 capsule (40 mg total) by mouth daily. 90 capsule 1   pravastatin (PRAVACHOL) 10 MG tablet TAKE 1 TABLET DAILY 90 tablet 3   tiZANidine (ZANAFLEX) 4 MG tablet Take 2 mg by mouth 3 (three) times daily as needed.     No current facility-administered medications for this visit.    Allergies as of 12/01/2023   (No Known Allergies)    Family History  Problem Relation Age of Onset   Arthritis Mother    Hyperlipidemia Mother    Arthritis Father    Hyperlipidemia Father    Diabetes Father    Diabetes Sister    Diabetes Maternal Grandfather    Diabetes Paternal Grandfather    Cancer Neg Hx    Colon cancer Neg Hx    Colon polyps Neg Hx    Esophageal cancer Neg Hx    Rectal cancer Neg Hx    Stomach cancer Neg Hx     Social History   Socioeconomic History   Marital status: Single    Spouse name: Not on file   Number of children: Not on file   Years of education: Not on file   Highest education level: Not on file  Occupational History   Not on file  Tobacco Use   Smoking status: Every Day    Types: E-cigarettes   Smokeless tobacco: Never   Tobacco comments:    Vapes daily  Vaping Use   Vaping status: Every Day   Substances: Nicotine, Flavoring  Substance and Sexual Activity   Alcohol use: Not Currently   Drug use: Never   Sexual activity: Yes    Partners: Female  Other Topics Concern   Not on file  Social History Narrative   Not on file   Social Drivers of Health   Financial Resource Strain: Patient Declined (02/03/2023)   Overall Financial Resource Strain (CARDIA)    Difficulty of Paying Living Expenses: Patient declined  Food Insecurity: Low Risk  (10/27/2023)   Received from [facility]   Hunger Vital Sign    Worried About Running Out of Food in  the Last Year: Never true    Ran Out of Food in the Last Year: Never true  Transportation Needs: No Transportation Needs (10/27/2023)   Received from [facility]    In the past 12 months, has lack of reliable transportation kept you from medical appointments, meetings, work or from getting things needed for daily living? : No  Physical Activity: Unknown (02/03/2023)  Exercise Vital Sign    Days of Exercise per Week: Patient declined    Minutes of Exercise per Session: Not on file  Stress: No Stress Concern Present (02/03/2023)   [employer] of Occupational Health - Occupational Stress Questionnaire    Feeling of Stress : Not at all  Social Connections: Unknown (02/03/2023)   Social Connection and Isolation Panel [NHANES]    Frequency of Communication with Friends and Family: Patient declined    Frequency of Social Gatherings with Friends and Family: Patient declined    Attends Religious Services: Patient declined    Database administrator or Organizations: No    Attends Engineer, structural: Not on file    Marital Status: Patient declined  Intimate Partner Violence: Not on file    Physical Exam: Vital signs in last 24 hours: There were no vitals taken for this visit. GEN: NAD EYE: Sclerae anicteric ENT: MMM CV: Non-tachycardic Pulm: No increased WOB GI: Soft NEURO:  Alert & Oriented   Eulah Pont, MD [facility]   12/01/2023 1:59 PM

## 2023-12-02 ENCOUNTER — Telehealth: Payer: Self-pay

## 2023-12-02 NOTE — Telephone Encounter (Signed)
  Follow up Call-     12/01/2023    2:07 PM  Call back number  Post procedure Call Back phone  # [phone]  Permission to leave phone message Yes     Patient questions:  Do you have a fever, pain , or abdominal swelling? No. Pain Score  0 *  Have you tolerated food without any problems? Yes.    Have you been able to return to your normal activities? Yes.    Do you have any questions about your discharge instructions: Diet   No. Medications  No. Follow up visit  No.  Do you have questions or concerns about your Care? No.  Actions: * If pain score is 4 or above: No action needed, pain <4.

## 2023-12-05 ENCOUNTER — Encounter: Payer: Self-pay | Admitting: Family Medicine

## 2023-12-08 LAB — SURGICAL PATHOLOGY

## 2023-12-08 MED ORDER — EMPAGLIFLOZIN 25 MG PO TABS: ORAL_TABLET | ORAL | 0 refills | Status: DC

## 2023-12-09 ENCOUNTER — Encounter: Payer: Self-pay | Admitting: Internal Medicine

## 2023-12-20 ENCOUNTER — Encounter: Payer: Self-pay | Admitting: Family Medicine

## 2023-12-22 MED ORDER — PRAVASTATIN SODIUM 10 MG PO TABS: 10.0000 mg | ORAL_TABLET | Freq: Every day | ORAL | 2 refills | Status: DC

## 2024-03-05 ENCOUNTER — Other Ambulatory Visit: Payer: Self-pay | Admitting: Family Medicine

## 2024-03-08 ENCOUNTER — Ambulatory Visit (INDEPENDENT_AMBULATORY_CARE_PROVIDER_SITE_OTHER): Payer: BC Managed Care – PPO | Admitting: Internal Medicine

## 2024-03-08 ENCOUNTER — Encounter: Payer: Self-pay | Admitting: Internal Medicine

## 2024-03-08 ENCOUNTER — Encounter: Payer: Self-pay | Admitting: Family Medicine

## 2024-03-08 VITALS — BP 108/80 | HR 81 | Ht 66.0 in | Wt 189.0 lb

## 2024-03-08 DIAGNOSIS — R112 Nausea with vomiting, unspecified: Secondary | ICD-10-CM

## 2024-03-08 DIAGNOSIS — R1013 Epigastric pain: Secondary | ICD-10-CM | POA: Diagnosis not present

## 2024-03-08 DIAGNOSIS — K5900 Constipation, unspecified: Secondary | ICD-10-CM | POA: Diagnosis not present

## 2024-03-08 DIAGNOSIS — R198 Other specified symptoms and signs involving the digestive system and abdomen: Secondary | ICD-10-CM

## 2024-03-08 DIAGNOSIS — R197 Diarrhea, unspecified: Secondary | ICD-10-CM

## 2024-03-08 DIAGNOSIS — R140 Abdominal distension (gaseous): Secondary | ICD-10-CM

## 2024-03-08 DIAGNOSIS — K219 Gastro-esophageal reflux disease without esophagitis: Secondary | ICD-10-CM

## 2024-03-08 DIAGNOSIS — H40013 Open angle with borderline findings, low risk, bilateral: Secondary | ICD-10-CM | POA: Diagnosis not present

## 2024-03-08 MED ORDER — OMEPRAZOLE 40 MG PO CPDR: 40.0000 mg | DELAYED_RELEASE_CAPSULE | Freq: Every day | ORAL | 1 refills | Status: DC

## 2024-03-08 NOTE — Progress Notes (Unsigned)
 03/09/2024 Mitchell Oneill [id number] 06-20-1976  ASSESSMENT AND PLAN:   Gas and bloating Dyspepsia, epigastric ab discomfort Nausea and vomiting Alternating constipation and diarrhea Possible GERD Patient has continued to struggle with gas and bloating.  He feels like food sits in his stomach and he will sometimes have nausea as well as vomiting.  Endorses epigastric abdominal discomfort that is suggestive of dyspepsia.  He also has alternating constipation and diarrhea.  Patient has had some mild improvement on omeprazole.  I did offer to increase his omeprazole to twice daily, but he declined at this time because he is not convinced that his symptoms are due to acid reflux.  Patient does have longstanding diabetes so he would be at risk for gastroparesis as well as SIBO.  Will plan for a gastric emptying study as well as SIBO breath test.  Will give him some samples of FDgard to see if this helps with his symptoms.  For his irregular bowel habits, Creon may be helping slightly.  Will check a fecal elastase to confirm whether or not he should continue taking this medication.  Patient has had good benefit from fiber supplementation in the past so I asked him to try this again. - Could try daily fiber supplement again - Check gastric emptying study  - SIBO breath test - FDGard. Will give samples today - Cont omeprazole 40 mg every day. Refill - Cont Creon - Check fecal elastase - Recall colon 2030  - RTC 3 months   History of Present Illness:  48 y.o. male with a past medical history of DM2 x 20 years, HTN, diarrhea presents for follow up of N&V  02/16/2019 Colon/EGD colon with tics, negative for microscopic colitis EGD normal negative for celiac.  Negative fecal calprotectin, giardia, sed rate/CRP, TSH.  02/11/2022 in the office with myself for diarrhea/constipation. KUB showed moderate stool, started on miralax/fiber. Normal pancreatic elastase.   Labs 10/2023: CBC and CMP  unremarkable. Lipase nml. TSH nml. ESR mildly elevated at 19.   RUQ U/S 10/20/23: IMPRESSION: 1. Increased hepatic parenchymal echogenicity suggestive of steatosis. 2. No cholelithiasis or sonographic evidence for acute cholecystitis.  EGD 12/01/23: - Esophageal mucosal variant. Biopsied. - Erythematous mucosa in the antrum. Biopsied. - Normal examined duodenum. Path:      1. 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      2. Surgical [P], mid esophagus and distal esophagus :       - ESOPHAGEAL SQUAMOUS AND CARDIAC MUCOSA WITH NO SPECIFIC HISTOPATHOLOGIC CHANGES       - NEGATIVE FOR INTESTINAL METAPLASIA OR DYSPLASIA   Interval History: Patient presents today with his fiance.  He feels like he has a lot of gas and sulfur burps. Denies chest burning or regurgitation. He feels like the food is stuck in his stomach. After he eats, he will feel nauseous and feel very gassy.  Sometimes he will vomit.  Omeprazole 40 mg once a day has helped a little, but he is still having these symptoms.  He never tried the twice a day dosing of omeprazole.  Patient has alternating constipation and diarrhea. When he has the gas, he will have ab pain in the epigastric area. He just eats dinner so he avoids eating in the daytime due to his GI symptoms. He works for [employer] and is driving in the daytime so he cannot go to the  bathroom whenever he would want. Creon may be helping a little bit. He will use Imodium occasionally when he has diarrhea. Fiber has helped him in the past, but he did not stick with this.  He has tried to identify potential food triggers in the past.  Current Medications:   Current Outpatient Medications (Endocrine & Metabolic):    Dulaglutide (TRULICITY) 1.5 MG/0.5ML SOPN, Inject 1.5 mg into the skin once a week.   empagliflozin (JARDIANCE) 25 MG TABS tablet, Take 1 tablet (25 mg total) by mouth daily.  Due for follow up  Current Outpatient Medications (Cardiovascular):    pravastatin (PRAVACHOL) 10 MG tablet, Take 1 tablet (10 mg total) by mouth daily.  Current Outpatient Medications (Respiratory):    fexofenadine (ALLEGRA) 180 MG tablet, Take 180 mg by mouth daily.  Current Outpatient Medications (Analgesics):    diclofenac (VOLTAREN) 75 MG EC tablet, Take 75 mg by mouth 2 (two) times daily.   Current Outpatient Medications (Other):    Blood Glucose Monitoring Suppl (ONE TOUCH ULTRA 2) w/Device KIT, 1 Device by Does not apply route 2 (two) times daily.   Continuous Glucose Sensor (DEXCOM G6 SENSOR) MISC, Change sensor q 10 days.   glucose blood test strip, Use as instructed   Lancets (ONETOUCH ULTRASOFT) lancets, Use as instructed   latanoprost (XALATAN) 0.005 % ophthalmic solution, Place 1 drop into both eyes at bedtime.   lipase/protease/amylase (CREON) 36000 UNITS CPEP capsule, Take 2 capsules with every meal and 1 capsule with snack   nortriptyline (PAMELOR) 10 MG capsule, Take 1 capsule (10 mg total) by mouth at bedtime.   tiZANidine (ZANAFLEX) 4 MG tablet, Take 2 mg by mouth 3 (three) times daily as needed.   Continuous Blood Gluc Transmit (DEXCOM G6 TRANSMITTER) MISC, 1 Device by Does not apply route every 3 (three) months. (Patient not taking: Reported on 03/08/2024)   omeprazole (PRILOSEC) 40 MG capsule, Take 1 capsule (40 mg total) by mouth daily.  Surgical History:  He  has a past surgical history that includes Wrist surgery (Left, 04/09/2023) and Wisdom tooth extraction. Family History:  His family history includes Arthritis in his father and mother; Diabetes in his father, maternal grandfather, paternal grandfather, and sister; Hyperlipidemia in his father and mother. Social History:   reports that he has been smoking e-cigarettes. He has never used smokeless tobacco. He reports that he does not currently use alcohol. He reports that he does not use drugs.  Physical  Exam: BP 108/80 (Cuff Size: Normal)   Pulse 81   Ht 5\' 6"  (1.676 m)   Wt 189 lb (85.7 kg)   BMI 30.51 kg/m  General:   Pleasant, well developed male in no acute distress Eyes: sclerae anicteric,conjunctive pink  Heart:  regular rate and rhythm Pulm: Clear anteriorly; no wheezing Abdomen:  Soft, Obese AB, skin exam normal, Normal bowel sounds. Mildly tender in the epigastric area Extremities:  Without edema. Peripheral pulses intact.  Neurologic:  Alert and  oriented x4;  grossly normal neurologically. Skin:   Dry and intact without significant lesions or rashes. Psychiatric: Demonstrates good judgement and reason without abnormal affect or behaviors.  Imogene Burn, MD 03/09/24  I spent 37 minutes of time, including in depth chart review, independent review of results as outlined above, communicating results with the patient directly, face-to-face time with the patient, coordinating care, and ordering studies and medications as appropriate, and documentation.

## 2024-03-08 NOTE — Patient Instructions (Addendum)
 You have been scheduled for a gastric emptying scan at [facility] on 03/19/24 at 7:30 am. Please arrive at least 30 minutes prior to your appointment for registration. Please make certain not to have anything to eat or drink after midnight the night before your test. Hold all stomach medications (ex: Zofran, phenergan, Reglan) 24 hours prior to your test. If you need to reschedule your appointment, please contact radiology scheduling at [phone].  A gastric-emptying study measures how long it takes for food to move through your stomach. There are several ways to measure stomach emptying. In the most common test, you eat food that contains a small amount of radioactive material. A scanner that detects the movement of the radioactive material is placed over your abdomen to monitor the rate at which food leaves your stomach. This test normally takes about 4 hours to complete. _____________________________________________________________________  Bonita Quin have been given a testing kit to check for small intestine bacterial overgrowth (SIBO) which is completed by a company named [facility]. Make sure to return your test in the mail using the return mailing label given to you along with the kit. The test order, your demographic and insurance information have all already been sent to the company. [facility] will collect an upfront charge of $99.74 for commercial insurance plans and $209.74 if you are paying cash. Make sure to discuss with [facility] PRIOR to having the test to see if they have gotten information from your insurance company as to how much your testing will cost out of pocket, if any. Please contact [facility] at phone number [phone] to get instructions regarding how to perform the test as our office is unable to give specific testing instructions.  We have sent the following medications to your pharmacy for you to pick up at your convenience: Omeprazole,  Start  taking metamucil daily  You are schedule for a follow up visit on 06/09/24 at 1:50 pm with Dr Leonides Schanz _______________________________________________________  If your blood pressure at your visit was 140/90 or greater, please contact your primary care physician to follow up on this.  _______________________________________________________  If you are age 32 or older, your body mass index should be between 23-30. Your Body mass index is 30.51 kg/m. If this is out of the aforementioned range listed, please consider follow up with your Primary Care Provider.  If you are age 72 or younger, your body mass index should be between 19-25. Your Body mass index is 30.51 kg/m. If this is out of the aformentioned range listed, please consider follow up with your Primary Care Provider.   ________________________________________________________  The [facility] providers would like to encourage you to use [facility] to communicate with providers for non-urgent requests or questions.  Due to long hold times on the telephone, sending your provider a message by [facility] may be a faster and more efficient way to get a response.  Please allow 48 business hours for a response.  Please remember that this is for non-urgent requests.  _______________________________________________________  Due to recent changes in healthcare laws, you may see the results of your imaging and laboratory studies on MyChart before your provider has had a chance to review them.  We understand that in some cases there may be results that are confusing or concerning to you. Not all laboratory results come back in the same time frame and the provider may be waiting for multiple results in order to interpret others.  Please give [place] 48 hours in order for your provider to  thoroughly review all the results before contacting the office for clarification of your results.   Thank you for entrusting me with your care and for choosing [facility],  Dr. Eulah Pont

## 2024-03-09 MED ORDER — EMPAGLIFLOZIN 25 MG PO TABS: 25.0000 mg | ORAL_TABLET | Freq: Every day | ORAL | 0 refills | Status: DC

## 2024-03-15 ENCOUNTER — Encounter: Payer: Self-pay | Admitting: Family Medicine

## 2024-03-15 ENCOUNTER — Ambulatory Visit (INDEPENDENT_AMBULATORY_CARE_PROVIDER_SITE_OTHER): Admitting: Family Medicine

## 2024-03-15 VITALS — BP 124/80 | HR 80 | Temp 98.6°F | Resp 12 | Ht 66.0 in | Wt 189.0 lb

## 2024-03-15 DIAGNOSIS — E1149 Type 2 diabetes mellitus with other diabetic neurological complication: Secondary | ICD-10-CM

## 2024-03-15 DIAGNOSIS — E1169 Type 2 diabetes mellitus with other specified complication: Secondary | ICD-10-CM | POA: Diagnosis not present

## 2024-03-15 DIAGNOSIS — Z7984 Long term (current) use of oral hypoglycemic drugs: Secondary | ICD-10-CM

## 2024-03-15 DIAGNOSIS — Z6830 Body mass index (BMI) 30.0-30.9, adult: Secondary | ICD-10-CM

## 2024-03-15 DIAGNOSIS — E6609 Other obesity due to excess calories: Secondary | ICD-10-CM

## 2024-03-15 DIAGNOSIS — I10 Essential (primary) hypertension: Secondary | ICD-10-CM

## 2024-03-15 DIAGNOSIS — E66811 Obesity, class 1: Secondary | ICD-10-CM | POA: Diagnosis not present

## 2024-03-15 DIAGNOSIS — E785 Hyperlipidemia, unspecified: Secondary | ICD-10-CM

## 2024-03-15 DIAGNOSIS — Z7985 Long-term (current) use of injectable non-insulin antidiabetic drugs: Secondary | ICD-10-CM

## 2024-03-15 LAB — POCT GLYCOSYLATED HEMOGLOBIN (HGB A1C): HbA1c, POC (prediabetic range): 6 % (ref 5.7–6.4)

## 2024-03-15 MED ORDER — EMPAGLIFLOZIN 25 MG PO TABS
25.0000 mg | ORAL_TABLET | Freq: Every day | ORAL | 2 refills | Status: AC
Start: 2024-03-15 — End: ?

## 2024-03-15 MED ORDER — TRULICITY 1.5 MG/0.5ML ~~LOC~~ SOAJ: 1.5000 mg | SUBCUTANEOUS | 2 refills | Status: DC

## 2024-03-15 NOTE — Patient Instructions (Addendum)
 A few things to remember from today's visit:  Type II diabetes mellitus with neurological manifestations (HCC) - Plan: POC HgB A1c, Dulaglutide (TRULICITY) 1.5 MG/0.5ML SOAJ  Hypertension, essential, benign No changes today.  If you need refills for medications you take chronically, please call your pharmacy. Do not use My Chart to request refills or for acute issues that need immediate attention. If you send a my chart message, it may take a few days to be addressed, specially if I am not in the office.  Please be sure medication list is accurate. If a new problem present, please set up appointment sooner than planned today.

## 2024-03-15 NOTE — Assessment & Plan Note (Signed)
 Last LDL 74 in 06/2023. Currently on pravastatin 10 mg daily. He is not fasting today, so we will plan on lipid panel next visit.

## 2024-03-15 NOTE — Assessment & Plan Note (Signed)
 HgA1C is at goal, it went from 7.8 to 6.0 today. Continue Trulicity 1.5 mg weekly and Jardiance 25 mg daily. Annual eye exam, periodic dental and foot care to continue. F/U in 6 months.

## 2024-03-15 NOTE — Assessment & Plan Note (Signed)
 BP adequately controlled. Continue non pharmacologic treatment. Recommend monitoring BP at home. Eye exam is current.

## 2024-03-15 NOTE — Progress Notes (Signed)
 HPI: Mitchell Oneill is a 48 y.o. male with a PMHx significant for HTN, IBS, GERD, DM II, and peripheral neuropathy, who is here today for chronic disease management.   Last seen on 07/07/2023  Diabetes Mellitus II: Dx'ed in 2000.  - Checking BG at home: He has not been checking his blood sugar at home.  - Medications: Currently on Trulicity 1.5 mg weekly and  Jardiance 25 mg daily - Diet: No dietary changes since his last visit.  - Exercise: Not regularly but he is back to work, so he has been more active. - eye exam: UTD on routine vision care. He notes he was dx'ed with bilateral glaucoma three weeks ago and currently on treatment..  - foot exam: UTD.  - Negative for symptoms of hypoglycemia, polyuria, polydipsia, numbness extremities, foot ulcers/trauma  Lab Results  Component Value Date   HGBA1C 7.8 03/15/2024   Lab Results  Component Value Date   MICROALBUR 0.9 02/03/2023   Hyperlipidemia: Currently on pravastatin 10 mg daily.  Lab Results  Component Value Date   CHOL 138 07/07/2023   HDL 44.80 07/07/2023   LDLCALC 74 07/07/2023   TRIG 98.0 07/07/2023   CHOLHDL 3 07/07/2023   Hypertension:  Not currently on pharmacologic treatment.  He has not been checking his BP at home.  Negative for unusual or severe headache, visual changes, exertional chest pain, dyspnea,  focal weakness, or edema.  Lab Results  Component Value Date   CREATININE 0.86 10/13/2023   BUN 10 10/13/2023   NA 144 10/13/2023   K 4.9 10/13/2023   CL 106 10/13/2023   CO2 29 10/13/2023   He mentions his left wrist ROM did not go back to baseline after injury. Has residual pain and decreased extension and flexion. He has followed with ortho.  He is still smoking E-cigarettes.   Review of Systems  Constitutional:  Negative for activity change, appetite change and fever.  HENT:  Negative for mouth sores and sore throat.   Respiratory:  Negative for cough and wheezing.   Gastrointestinal:   Negative for abdominal pain, nausea and vomiting.  Genitourinary:  Negative for decreased urine volume, dysuria and hematuria.  Musculoskeletal:  Positive for arthralgias.  Skin:  Negative for rash.  Neurological:  Negative for syncope and facial asymmetry.  Psychiatric/Behavioral:  Negative for confusion. The patient is not nervous/anxious.   See other pertinent positives and negatives in HPI.  Current Outpatient Medications on File Prior to Visit  Medication Sig Dispense Refill   Blood Glucose Monitoring Suppl (ONE TOUCH ULTRA 2) w/Device KIT 1 Device by Does not apply route 2 (two) times daily. 1 each 0   Continuous Blood Gluc Transmit (DEXCOM G6 TRANSMITTER) MISC 1 Device by Does not apply route every 3 (three) months. 1 each 3   Continuous Glucose Sensor (DEXCOM G6 SENSOR) MISC Change sensor q 10 days. 3 each 3   diclofenac (VOLTAREN) 75 MG EC tablet Take 75 mg by mouth 2 (two) times daily.     fexofenadine (ALLEGRA) 180 MG tablet Take 180 mg by mouth daily.     glucose blood test strip Use as instructed 180 each 3   Lancets (ONETOUCH ULTRASOFT) lancets Use as instructed 180 each 3   latanoprost (XALATAN) 0.005 % ophthalmic solution Place 1 drop into both eyes at bedtime.     lipase/protease/amylase (CREON) 36000 UNITS CPEP capsule Take 2 capsules with every meal and 1 capsule with snack 300 capsule 11   nortriptyline (PAMELOR) 10  MG capsule Take 1 capsule (10 mg total) by mouth at bedtime. 30 capsule 1   omeprazole (PRILOSEC) 40 MG capsule Take 1 capsule (40 mg total) by mouth daily. 90 capsule 1   pravastatin (PRAVACHOL) 10 MG tablet Take 1 tablet (10 mg total) by mouth daily. 90 tablet 2   tiZANidine (ZANAFLEX) 4 MG tablet Take 2 mg by mouth 3 (three) times daily as needed.     No current facility-administered medications on file prior to visit.   Past Medical History:  Diagnosis Date   Allergy    seasonal   Chicken pox    Diabetes mellitus without complication (HCC)     Hypertension    IBS (irritable bowel syndrome)    No Known Allergies  Social History   Socioeconomic History   Marital status: Single    Spouse name: Not on file   Number of children: Not on file   Years of education: Not on file   Highest education level: 12th grade  Occupational History   Not on file  Tobacco Use   Smoking status: Every Day    Types: E-cigarettes   Smokeless tobacco: Never   Tobacco comments:    Vapes daily  Vaping Use   Vaping status: Every Day   Substances: Nicotine, Flavoring  Substance and Sexual Activity   Alcohol use: Not Currently   Drug use: Never   Sexual activity: Yes    Partners: Female  Other Topics Concern   Not on file  Social History Narrative   Not on file   Social Drivers of Health   Financial Resource Strain: Low Risk  (03/14/2024)   Overall Financial Resource Strain (CARDIA)    Difficulty of Paying Living Expenses: Not hard at all  Food Insecurity: No Food Insecurity (03/14/2024)   Hunger Vital Sign    Worried About Running Out of Food in the Last Year: Never true    Ran Out of Food in the Last Year: Never true  Transportation Needs: No Transportation Needs (03/14/2024)   PRAPARE - Administrator, Civil Service (Medical): No    Lack of Transportation (Non-Medical): No  Physical Activity: Sufficiently Active (03/14/2024)   Exercise Vital Sign    Days of Exercise per Week: 5 days    Minutes of Exercise per Session: 30 min  Stress: No Stress Concern Present (03/14/2024)   [employer] of Occupational Health - Occupational Stress Questionnaire    Feeling of Stress : Not at all  Social Connections: Moderately Isolated (03/14/2024)   Social Connection and Isolation Panel [NHANES]    Frequency of Communication with Friends and Family: More than three times a week    Frequency of Social Gatherings with Friends and Family: More than three times a week    Attends Religious Services: Never    Database administrator or  Organizations: No    Attends Banker Meetings: Not on file    Marital Status: Living with partner   Vitals:   03/15/24 1105  BP: 124/80  Pulse: 80  Resp: 12  Temp: 98.6 F (37 C)  SpO2: 100%   Wt Readings from Last 3 Encounters:  03/15/24 189 lb (85.7 kg)  03/08/24 189 lb (85.7 kg)  12/01/23 195 lb (88.5 kg)   Body mass index is 30.51 kg/m.  Physical Exam Vitals and nursing note reviewed.  Constitutional:      General: He is not in acute distress.    Appearance: He is well-developed.  HENT:     Head: Normocephalic and atraumatic.     Mouth/Throat:     Mouth: Mucous membranes are moist.  Eyes:     Conjunctiva/sclera: Conjunctivae normal.  Cardiovascular:     Rate and Rhythm: Normal rate and regular rhythm.     Pulses:          Dorsalis pedis pulses are 2+ on the right side and 2+ on the left side.     Heart sounds: No murmur heard. Pulmonary:     Effort: Pulmonary effort is normal. No respiratory distress.     Breath sounds: Normal breath sounds.  Abdominal:     Palpations: Abdomen is soft. There is no hepatomegaly or mass.     Tenderness: There is no abdominal tenderness.  Musculoskeletal:     Right lower leg: No edema.     Left lower leg: No edema.  Lymphadenopathy:     Cervical: No cervical adenopathy.  Skin:    General: Skin is warm.     Findings: No erythema or rash.  Neurological:     Mental Status: He is alert and oriented to person, place, and time.     Cranial Nerves: No cranial nerve deficit.     Gait: Gait normal.  Psychiatric:        Mood and Affect: Mood and affect normal.   ASSESSMENT AND PLAN:  Mr. Burkhalter was seen today for chronic disease management.   Orders Placed This Encounter  Procedures   POC HgB A1c   Lab Results  Component Value Date   HGBA1C 6.0 03/15/2024   .Type II diabetes mellitus with neurological manifestations [facility]) Assessment & Plan: HgA1C is at goal, it went from 7.8 to 6.0 today. Continue Trulicity  1.5 mg weekly and Jardiance 25 mg daily. Annual eye exam, periodic dental and foot care to continue. F/U in 6 months.  Orders: -     POCT glycosylated hemoglobin (Hb A1C) -     Trulicity; Inject 1.5 mg into the skin once a week.  Dispense: 6 mL; Refill: 2 -     Empagliflozin; Take 1 tablet (25 mg total) by mouth daily. Due for follow up  Dispense: 90 tablet; Refill: 2  Hypertension, essential, benign Assessment & Plan: BP adequately controlled. Continue non pharmacologic treatment. Recommend monitoring BP at home. Eye exam is current.   Class 1 obesity due to excess calories with serious comorbidity and body mass index (BMI) of 30.0 to 30.9 in adult Assessment & Plan:  Lost about 10 Lb sine 06/2023. Patient understands the benefits of wt loss as well as adverse effects of obesity. Consistency with healthy diet and physical activity encouraged.   Hyperlipidemia associated with type 2 diabetes mellitus (HCC) Assessment & Plan: Last LDL 74 in 06/2023. Currently on pravastatin 10 mg daily. He is not fasting today, so we will plan on lipid panel next visit.   Return in about 6 months (around 09/14/2024).  I, Rolla Etienne Wierda, acting as a scribe for Demerius Podolak [place], MD., have documented all relevant documentation on the behalf of Kiaira Pointer [place], MD, as directed by  Kayleen Alig [place], MD while in the presence of Aasir Daigler [place], MD.   I, Deneane Stifter [place], MD, have reviewed all documentation for this visit. The documentation on 03/15/24 for the exam, diagnosis, procedures, and orders are all accurate and complete.  Rorik Vespa G. [place], MD  [facility]. [place] office.

## 2024-03-15 NOTE — Assessment & Plan Note (Signed)
 Lost about 10 Lb sine 06/2023. Patient understands the benefits of wt loss as well as adverse effects of obesity. Consistency with healthy diet and physical activity encouraged.

## 2024-03-19 ENCOUNTER — Other Ambulatory Visit (HOSPITAL_COMMUNITY)

## 2024-03-29 ENCOUNTER — Other Ambulatory Visit (HOSPITAL_COMMUNITY)

## 2024-04-19 ENCOUNTER — Ambulatory Visit (HOSPITAL_COMMUNITY)

## 2024-06-09 ENCOUNTER — Ambulatory Visit: Admitting: Internal Medicine

## 2024-06-21 DIAGNOSIS — H25813 Combined forms of age-related cataract, bilateral: Secondary | ICD-10-CM | POA: Diagnosis not present

## 2024-06-21 DIAGNOSIS — E113293 Type 2 diabetes mellitus with mild nonproliferative diabetic retinopathy without macular edema, bilateral: Secondary | ICD-10-CM | POA: Diagnosis not present

## 2024-06-21 DIAGNOSIS — H35033 Hypertensive retinopathy, bilateral: Secondary | ICD-10-CM | POA: Diagnosis not present

## 2024-06-21 DIAGNOSIS — H43813 Vitreous degeneration, bilateral: Secondary | ICD-10-CM | POA: Diagnosis not present

## 2024-06-24 ENCOUNTER — Other Ambulatory Visit: Payer: Self-pay | Admitting: Internal Medicine

## 2024-06-24 DIAGNOSIS — R112 Nausea with vomiting, unspecified: Secondary | ICD-10-CM

## 2024-06-24 DIAGNOSIS — K219 Gastro-esophageal reflux disease without esophagitis: Secondary | ICD-10-CM

## 2024-06-24 DIAGNOSIS — R1013 Epigastric pain: Secondary | ICD-10-CM

## 2024-09-13 ENCOUNTER — Encounter: Payer: Self-pay | Admitting: Family Medicine

## 2024-09-13 ENCOUNTER — Ambulatory Visit: Admitting: Family Medicine

## 2024-09-13 VITALS — BP 128/74 | HR 76 | Temp 97.9°F | Resp 16 | Ht 66.0 in | Wt 184.8 lb

## 2024-09-13 DIAGNOSIS — E538 Deficiency of other specified B group vitamins: Secondary | ICD-10-CM | POA: Diagnosis not present

## 2024-09-13 DIAGNOSIS — R5383 Other fatigue: Secondary | ICD-10-CM

## 2024-09-13 DIAGNOSIS — Z7984 Long term (current) use of oral hypoglycemic drugs: Secondary | ICD-10-CM

## 2024-09-13 DIAGNOSIS — Z7985 Long-term (current) use of injectable non-insulin antidiabetic drugs: Secondary | ICD-10-CM

## 2024-09-13 DIAGNOSIS — E1149 Type 2 diabetes mellitus with other diabetic neurological complication: Secondary | ICD-10-CM

## 2024-09-13 DIAGNOSIS — E785 Hyperlipidemia, unspecified: Secondary | ICD-10-CM

## 2024-09-13 DIAGNOSIS — E1169 Type 2 diabetes mellitus with other specified complication: Secondary | ICD-10-CM | POA: Diagnosis not present

## 2024-09-13 DIAGNOSIS — Z23 Encounter for immunization: Secondary | ICD-10-CM

## 2024-09-13 LAB — MICROALBUMIN / CREATININE URINE RATIO
Creatinine,U: 60.4 mg/dL
Microalb Creat Ratio: UNDETERMINED mg/g (ref 0.0–30.0)
Microalb, Ur: <0.7 mg/dL

## 2024-09-13 LAB — HEMOGLOBIN A1C: Hgb A1c MFr Bld: 6.3 % (ref 4.6–6.5)

## 2024-09-13 MED ORDER — CYANOCOBALAMIN 1000 MCG/ML IJ SOLN
1000.0000 ug | Freq: Once | INTRAMUSCULAR | Status: AC
  Administered 2024-09-13: 1000 ug via INTRAMUSCULAR

## 2024-09-13 NOTE — Progress Notes (Unsigned)
 HPI:  Chief Complaint  Patient presents with   Medical Management of Chronic Issues    Six month follow-up     [name] is a 48 y.o. male, who is here today for chronic disease management. Last seen on *** Discussed the use of AI scribe software for clinical note transcription with the patient, who gave verbal consent to proceed.  History of Present Illness Mitchell Oneill is a 48 year old male with diabetes and glaucoma who presents for a follow-up visit.  He has been monitoring his blood sugar levels, which were well-controlled at his last check. He is currently taking Jardiance  and Trulicity  for diabetes management. He experiences occasional difficulty with eating lunch but appreciates that these medications do not cause significant hypoglycemia. No recent changes in vision or significant hypoglycemia.  He has a history of elevated blood pressure but is not currently on any antihypertensive medication. He previously took lisinopril but has not been on it recently. His blood pressure has been well-controlled without medication. He has not been monitoring his blood pressure at home.  He is taking pravastatin  10 mg for cholesterol management. He experiences fatigue, which he attributes to his work and the summer heat. He experiences dizziness when standing up quickly, especially during hot weather, which he associates with dehydration. He works outdoors and acknowledges the importance of staying hydrated.  He has a history of glaucoma in his left eye and uses timolol eye drops daily. He had an eye exam recently and was prescribed these drops to manage intraocular pressure.  He experiences numbness and tingling in his feet. He has not been taking vitamin B12 supplements, but his last test in 2018 showed levels on the lower end of normal.  He received the pneumonia vaccine in 2020 and plans to get the flu vaccine today. He has a history of diabetes, which places him at higher risk for  complications from COVID-19, but his COVID-19 vaccination status is not discussed.   Hypertension:  Medications:*** BP readings at home:*** Side effects:***  Negative for unusual or severe headache, visual changes, exertional chest pain, dyspnea,  focal weakness, or edema.  Lab Results  Component Value Date   CREATININE 0.86 10/13/2023   BUN 10 10/13/2023   NA 144 10/13/2023   K 4.9 10/13/2023   CL 106 10/13/2023   CO2 29 10/13/2023    Diabetes Mellitus II:  - Checking BG at home: *** - Medications: *** - Compliance: *** - Diet: *** - Exercise: *** - eye exam: Current, left eye glaucoma. - foot exam: ***  - Negative for symptoms of hypoglycemia, polyuria, polydipsia, numbness extremities, foot ulcers/trauma  Lab Results  Component Value Date   HGBA1C 6.0 03/15/2024   Hyperlipidemia: Currently on *** Following a low fat diet: ***. Side effects from medication:*** Lab Results  Component Value Date   CHOL 138 07/07/2023   HDL 44.80 07/07/2023   LDLCALC 74 07/07/2023   TRIG 98.0 07/07/2023   CHOLHDL 3 07/07/2023   Lab Results  Component Value Date   VITAMINB12 396 07/22/2017    Review of Systems See other pertinent positives and negatives in HPI.  Current Outpatient Medications on File Prior to Visit  Medication Sig Dispense Refill   Blood Glucose Monitoring Suppl (ONE TOUCH ULTRA 2) w/Device KIT 1 Device by Does not apply route 2 (two) times daily. 1 each 0   Continuous Blood Gluc Transmit (DEXCOM G6 TRANSMITTER) MISC 1 Device by Does not apply route every 3 (three) months. 1 each  3   Continuous Glucose Sensor (DEXCOM G6 SENSOR) MISC Change sensor q 10 days. 3 each 3   diclofenac (VOLTAREN) 75 MG EC tablet Take 75 mg by mouth 2 (two) times daily.     Dulaglutide  (TRULICITY ) 1.5 MG/0.5ML SOAJ Inject 1.5 mg into the skin once a week. 6 mL 2   empagliflozin  (JARDIANCE ) 25 MG TABS tablet Take 1 tablet (25 mg total) by mouth daily. Due for follow up 90 tablet 2    fexofenadine (ALLEGRA) 180 MG tablet Take 180 mg by mouth daily.     glucose blood test strip Use as instructed 180 each 3   Lancets (ONETOUCH ULTRASOFT) lancets Use as instructed 180 each 3   latanoprost (XALATAN) 0.005 % ophthalmic solution Place 1 drop into both eyes at bedtime.     lipase/protease/amylase (CREON ) 36000 UNITS CPEP capsule Take 2 capsules with every meal and 1 capsule with snack 300 capsule 11   nortriptyline  (PAMELOR ) 10 MG capsule Take 1 capsule (10 mg total) by mouth at bedtime. 30 capsule 1   omeprazole  (PRILOSEC) 40 MG capsule Take 1 capsule (40 mg total) by mouth daily. 180 capsule 1   pravastatin  (PRAVACHOL ) 10 MG tablet Take 1 tablet (10 mg total) by mouth daily. 90 tablet 2   tiZANidine (ZANAFLEX) 4 MG tablet Take 2 mg by mouth 3 (three) times daily as needed.     No current facility-administered medications on file prior to visit.    Past Medical History:  Diagnosis Date   Allergy    seasonal   Chicken pox    Diabetes mellitus without complication (HCC)    Hypertension    IBS (irritable bowel syndrome)     No Known Allergies  Social History   Socioeconomic History   Marital status: Single    Spouse name: Not on file   Number of children: Not on file   Years of education: Not on file   Highest education level: 12th grade  Occupational History   Not on file  Tobacco Use   Smoking status: Every Day    Types: E-cigarettes   Smokeless tobacco: Never   Tobacco comments:    Vapes daily  Vaping Use   Vaping status: Every Day   Substances: Nicotine, Flavoring  Substance and Sexual Activity   Alcohol use: Not Currently   Drug use: Never   Sexual activity: Yes    Partners: Female  Other Topics Concern   Not on file  Social History Narrative   Not on file   Social Drivers of Health   Financial Resource Strain: Low Risk  (09/12/2024)   Overall Financial Resource Strain (CARDIA)    Difficulty of Paying Living Expenses: Not hard at all  Food  Insecurity: No Food Insecurity (09/12/2024)   Hunger Vital Sign    Worried About Running Out of Food in the Last Year: Never true    Ran Out of Food in the Last Year: Never true  Transportation Needs: No Transportation Needs (09/12/2024)   PRAPARE - Administrator, Civil Service (Medical): No    Lack of Transportation (Non-Medical): No  Physical Activity: Inactive (09/12/2024)   Exercise Vital Sign    Days of Exercise per Week: 7 days    Minutes of Exercise per Session: 0 min  Stress: No Stress Concern Present (09/12/2024)   [employer] of Occupational Health - Occupational Stress Questionnaire    Feeling of Stress: Not at all  Social Connections: Socially Isolated (09/12/2024)  Social Connection and Isolation Panel    Frequency of Communication with Friends and Family: Once a week    Frequency of Social Gatherings with Friends and Family: Once a week    Attends Religious Services: Never    Database administrator or Organizations: No    Attends Engineer, structural: Not on file    Marital Status: Living with partner    Today's Vitals   09/13/24 1129  BP: 128/74  Pulse: 76  Resp: 16  Temp: 97.9 F (36.6 C)  SpO2: 99%  Weight: 184 lb 12.8 oz (83.8 kg)  Height: 5' 6 (1.676 m)    Body mass index is 29.83 kg/m.  Physical Exam Vitals and nursing note reviewed.  Constitutional:      General: He is not in acute distress.    Appearance: He is well-developed.  HENT:     Head: Normocephalic and atraumatic.     Mouth/Throat:     Mouth: Mucous membranes are moist.  Eyes:     Conjunctiva/sclera: Conjunctivae normal.  Cardiovascular:     Rate and Rhythm: Normal rate and regular rhythm.     Pulses:          Dorsalis pedis pulses are 2+ on the right side and 2+ on the left side.     Heart sounds: No murmur heard. Pulmonary:     Effort: Pulmonary effort is normal. No respiratory distress.     Breath sounds: Normal breath sounds.  Abdominal:      Palpations: Abdomen is soft. There is no hepatomegaly or mass.     Tenderness: There is no abdominal tenderness.  Musculoskeletal:     Right lower leg: No edema.     Left lower leg: No edema.  Lymphadenopathy:     Cervical: No cervical adenopathy.  Skin:    General: Skin is warm.     Findings: No erythema or rash.  Neurological:     Mental Status: He is alert and oriented to person, place, and time.     Cranial Nerves: No cranial nerve deficit.     Gait: Gait normal.  Psychiatric:        Mood and Affect: Mood and affect normal.    ASSESSMENT AND PLAN:  Mitchell Oneill was seen today for medical management of chronic issues.  Diagnoses and all orders for this visit:  Orders Placed This Encounter  Procedures   Flu vaccine trivalent PF, 6mos and older(Flulaval,Afluria,Fluarix,Fluzone)   Comprehensive metabolic panel with GFR   Hemoglobin A1c   Lipid panel   Microalbumin / creatinine urine ratio    Type II diabetes mellitus with neurological manifestations [facility]) Assessment & Plan: Problem has been adequately controlled. Last hemoglobin A1c 6.0 in 03/2024. Continue Trulicity  1.5 mg weekly and Jardiance  25 mg daily. Further recommendation will be given according to hemoglobin A1c result Annual eye exam, periodic dental and foot care to continue. F/U in 6 months.  Orders: -     Comprehensive metabolic panel with GFR; Future -     Hemoglobin A1c; Future -     Microalbumin / creatinine urine ratio; Future  Fatigue, unspecified type  B12 deficiency Assessment & Plan: Last B12 396 in 07/2017. He is not on B12 supplementation. He would like a B12 injection today, hoping he will help with fatigue. After verbal consent, B12 at 1000 mcg IM was given today.  Orders: -     Cyanocobalamin  Hyperlipidemia associated with type 2 diabetes mellitus (HCC) Assessment & Plan:  Currently on pravastatin  10 mg daily. Last LDL 74 in 06/2023. Further recommendation will be given according  to lipid panel result.  Orders: -     Comprehensive metabolic panel with GFR; Future -     Lipid panel; Future  Need for immunization against influenza -     Flu vaccine trivalent PF, 6mos and older(Flulaval,Afluria,Fluarix,Fluzone)     Return in about 6 months (around 03/14/2025) for CPE, chronic problems.   Mitchell Kersh [place], MD [facility]. [place] office.

## 2024-09-13 NOTE — Assessment & Plan Note (Signed)
 Currently on pravastatin  10 mg daily. Last LDL 74 in 06/2023. Further recommendation will be given according to lipid panel result.

## 2024-09-13 NOTE — Assessment & Plan Note (Signed)
 Last B12 396 in 07/2017. He is not on B12 supplementation. He would like a B12 injection today, hoping he will help with fatigue. After verbal consent, B12 at 1000 mcg IM was given today.

## 2024-09-13 NOTE — Assessment & Plan Note (Signed)
 Problem has been adequately controlled. Last hemoglobin A1c 6.0 in 03/2024. Continue Trulicity  1.5 mg weekly and Jardiance  25 mg daily. Further recommendation will be given according to hemoglobin A1c result Annual eye exam, periodic dental and foot care to continue. F/U in 6 months.

## 2024-09-13 NOTE — Patient Instructions (Signed)
 A few things to remember from today's visit:  Need for immunization against influenza - Plan: Flu vaccine trivalent PF, 6mos and older(Flulaval,Afluria,Fluarix,Fluzone)  Fatigue, unspecified type  B12 deficiency  Type II diabetes mellitus with neurological manifestations (HCC) - Plan: Comprehensive metabolic panel with GFR, Hemoglobin A1c, Microalbumin / creatinine urine ratio  Hyperlipidemia associated with type 2 diabetes mellitus (HCC) - Plan: Comprehensive metabolic panel with GFR, Lipid panel  No changes today.  If you need refills for medications you take chronically, please call your pharmacy. Do not use My Chart to request refills or for acute issues that need immediate attention. If you send a my chart message, it may take a few days to be addressed, specially if I am not in the office.  Please be sure medication list is accurate. If a new problem present, please set up appointment sooner than planned today.

## 2024-09-14 ENCOUNTER — Encounter: Payer: Self-pay | Admitting: Family Medicine

## 2024-09-14 ENCOUNTER — Ambulatory Visit: Payer: Self-pay | Admitting: Family Medicine

## 2024-09-14 LAB — COMPREHENSIVE METABOLIC PANEL WITH GFR
ALT: 28 U/L (ref 0–53)
AST: 21 U/L (ref 0–37)
Albumin: 4.5 g/dL (ref 3.5–5.2)
Alkaline Phosphatase: 48 U/L (ref 39–117)
BUN: 11 mg/dL (ref 6–23)
CO2: 30 meq/L (ref 19–32)
Calcium: 9.7 mg/dL (ref 8.4–10.5)
Chloride: 103 meq/L (ref 96–112)
Creatinine, Ser: 0.73 mg/dL (ref 0.40–1.50)
GFR: 107.12 mL/min (ref >60.00)
Glucose, Bld: 95 mg/dL (ref 70–99)
Potassium: 4.5 meq/L (ref 3.5–5.1)
Sodium: 141 meq/L (ref 135–145)
Total Bilirubin: 1 mg/dL (ref 0.2–1.2)
Total Protein: 7.4 g/dL (ref 6.0–8.3)

## 2024-09-14 LAB — LIPID PANEL
Cholesterol: 132 mg/dL (ref 0–200)
HDL: 55.9 mg/dL (ref >39.00)
LDL Cholesterol: 66 mg/dL (ref 0–99)
NonHDL: 76.12
Total CHOL/HDL Ratio: 2
Triglycerides: 49 mg/dL (ref 0.0–149.0)
VLDL: 9.8 mg/dL (ref 0.0–40.0)

## 2024-09-14 MED ORDER — PRAVASTATIN SODIUM 10 MG PO TABS: 10.0000 mg | ORAL_TABLET | Freq: Every day | ORAL | 3 refills | Status: AC

## 2024-10-16 ENCOUNTER — Encounter: Payer: Self-pay | Admitting: Family Medicine

## 2024-10-25 DIAGNOSIS — E113213 Type 2 diabetes mellitus with mild nonproliferative diabetic retinopathy with macular edema, bilateral: Secondary | ICD-10-CM | POA: Diagnosis not present

## 2024-10-25 DIAGNOSIS — H35033 Hypertensive retinopathy, bilateral: Secondary | ICD-10-CM | POA: Diagnosis not present

## 2024-10-25 DIAGNOSIS — H43813 Vitreous degeneration, bilateral: Secondary | ICD-10-CM | POA: Diagnosis not present

## 2024-10-25 DIAGNOSIS — H25813 Combined forms of age-related cataract, bilateral: Secondary | ICD-10-CM | POA: Diagnosis not present

## 2024-10-25 LAB — OPHTHALMOLOGY REPORT-SCANNED

## 2024-12-10 ENCOUNTER — Other Ambulatory Visit: Payer: Self-pay

## 2024-12-10 DIAGNOSIS — E1149 Type 2 diabetes mellitus with other diabetic neurological complication: Secondary | ICD-10-CM

## 2024-12-10 MED ORDER — TRULICITY 1.5 MG/0.5ML ~~LOC~~ SOAJ: 1.5000 mg | SUBCUTANEOUS | 2 refills | Status: AC

## 2025-03-14 ENCOUNTER — Encounter: Admitting: Family Medicine
